# Patient Record
Sex: Female | Born: 1935 | Race: White | Hispanic: No | Smoking: Never smoker
Health system: Southern US, Community
[De-identification: ages and names within clinical notes are randomized; demographics above are authoritative.]

## PROBLEM LIST (undated history)

## (undated) DIAGNOSIS — I82409 Acute embolism and thrombosis of unspecified deep veins of unspecified lower extremity: Secondary | ICD-10-CM

## (undated) DIAGNOSIS — E785 Hyperlipidemia, unspecified: Secondary | ICD-10-CM

## (undated) DIAGNOSIS — K219 Gastro-esophageal reflux disease without esophagitis: Secondary | ICD-10-CM

## (undated) DIAGNOSIS — M1990 Unspecified osteoarthritis, unspecified site: Secondary | ICD-10-CM

## (undated) DIAGNOSIS — I8390 Asymptomatic varicose veins of unspecified lower extremity: Secondary | ICD-10-CM

## (undated) HISTORY — DX: Hyperlipidemia, unspecified: E78.5

## (undated) HISTORY — DX: Acute embolism and thrombosis of unspecified deep veins of unspecified lower extremity: I82.409

## (undated) HISTORY — DX: Asymptomatic varicose veins of unspecified lower extremity: I83.90

## (undated) HISTORY — PX: APPENDECTOMY: SHX54

## (undated) HISTORY — PX: OTHER SURGICAL HISTORY: SHX169

## (undated) HISTORY — DX: Gastro-esophageal reflux disease without esophagitis: K21.9

## (undated) HISTORY — PX: VEIN LIGATION AND STRIPPING: SHX2653

---

## 2002-01-19 ENCOUNTER — Ambulatory Visit (HOSPITAL_COMMUNITY): Admission: RE | Admit: 2002-01-19 | Discharge: 2002-01-19 | Payer: Self-pay | Admitting: Gastroenterology

## 2004-10-30 ENCOUNTER — Other Ambulatory Visit: Admission: RE | Admit: 2004-10-30 | Discharge: 2004-10-30 | Payer: Self-pay | Admitting: *Deleted

## 2008-08-22 ENCOUNTER — Ambulatory Visit (HOSPITAL_BASED_OUTPATIENT_CLINIC_OR_DEPARTMENT_OTHER): Admission: RE | Admit: 2008-08-22 | Discharge: 2008-08-23 | Payer: Self-pay | Admitting: Orthopedic Surgery

## 2008-09-05 ENCOUNTER — Encounter: Admission: RE | Admit: 2008-09-05 | Discharge: 2008-09-05 | Payer: Self-pay | Admitting: Orthopedic Surgery

## 2008-09-08 ENCOUNTER — Inpatient Hospital Stay (HOSPITAL_COMMUNITY): Admission: RE | Admit: 2008-09-08 | Discharge: 2008-09-12 | Payer: Self-pay | Admitting: Surgery

## 2008-09-09 ENCOUNTER — Ambulatory Visit: Payer: Self-pay | Admitting: Physical Medicine & Rehabilitation

## 2008-11-21 ENCOUNTER — Other Ambulatory Visit: Admission: RE | Admit: 2008-11-21 | Discharge: 2008-11-21 | Payer: Self-pay | Admitting: Family Medicine

## 2008-11-22 ENCOUNTER — Encounter: Admission: RE | Admit: 2008-11-22 | Discharge: 2009-01-24 | Payer: Self-pay | Admitting: Orthopedic Surgery

## 2008-12-09 ENCOUNTER — Encounter: Admission: RE | Admit: 2008-12-09 | Discharge: 2008-12-09 | Payer: Self-pay | Admitting: Family Medicine

## 2011-03-12 NOTE — Op Note (Signed)
Rachel Spencer, Rachel Spencer                 ACCOUNT NO.:  [id number]   MEDICAL RECORD NO.:  [id number]          PATIENT TYPE:  AMB   LOCATION:  DSC                          FACILITY:  [facility]   PHYSICIAN:  Feliberto Gottron. Turner Daniels, M.D.   DATE OF BIRTH:  01/02/1934   DATE OF PROCEDURE:  08/22/2008  DATE OF DISCHARGE:                               OPERATIVE REPORT   PREOPERATIVE DIAGNOSIS:  Left ankle displaced bimalleolar fracture.   POSTOPERATIVE DIAGNOSIS:  Left ankle displaced bimalleolar fracture.   PROCEDURE:  Open reduction and internal fixation of left ankle  bimalleolar fracture using a 7-hole lateral one-third tubular plate with  3 proximal and 3 distal screws, 2 anterior-to-posterior interfragmentary  screws and a single medial cannulated 3.5-mm lag screw, because of  foreskin on the medial side of the ankle.   SURGEON:  Feliberto Gottron. Turner Daniels, MD   FIRST ASSISTANT:  Shirl Harris, PA-C   ANESTHETIC:  General endotracheal.   ESTIMATED BLOOD LOSS:  100 mL.   FLUID REPLACEMENT:  1400 mL of crystalloid.   DRAINS PLACED:  None.   TOURNIQUET TIME:  39 minutes.   INDICATIONS FOR PROCEDURE:  A 77 year old woman who slipped on a  driveway that had some acorns on it a few days ago and sustained a  displaced left ankle bimalleolar fracture and [facility].  Closed reduction was accomplished, and she was placed in a  posterior splint with a stirrup.  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  The skin looked pretty good.  The plan was to do a lateral  incision with the side plate and medially because it was minimally  displaced after the closed reduction attempt to hold out with a  cannulated screw.  Risks and benefits of surgery have been discussed and  questions answered.   DESCRIPTION OF PROCEDURE:  The patient was identified by  armband, taken  to the operating room at [facility].  Appropriate  anesthetic monitors were attached and received preoperative IV  antibiotics and was then taken to operating room 5 where the appropriate  anesthetic monitors were attached and general endotracheal anesthesia  induced with the patient in supine position.  Tourniquet applied to the  left calf.  Left lower extremity was prepped, draped in usual sterile  fashion from the tourniquet to the toes.  Limb wrapped with an Esmarch  bandage.  The tourniquet was inflated to 300 mmHg and a standard time-  out procedure was then performed.  Starting on the lateral side, the  incision was made from the tip of the lateral malleolus proximally for  about 10 cm through the skin and the subcutaneous tissue.  Small  bleeders were identified and cauterized.  A transversing vein was  preserved throughout the procedure.  We went down to the periosteum over  the fracture site, was reflected anteriorly and posteriorly and then  using  a tenaculum, applied traction to the distal fragment and held the  reduction with a lion-jaw clamp.  Two anterior-to-posterior 4.0-mm  cancellous lag screws were then used because of the patient's relatively  soft bone quality and then we fixed the fibula itself with a 7-hole one-  third tubular neutralization plate, 3 cortical screws proximally and  then 3 distal cancellous screws, one of them bicortical above this  plafond and 2 of them unicortical below the plafond.  C-arm images were  taken confirming excellent reduction of the fibula and then under C-arm  control, a guide pin from the 3.5 cannulated screw set was placed up  through the tip of the medial malleolus through the fracture site and up  into the metaphysis of the tibia in satisfactory position.  It was then  overreamed with a reamer for the 3.5 cannulated screw and then a 46-mm  3.5 cannulated screw was placed over the guidewire obtaining  good firm  fixation of the medial malleolus.  Final C-arm images were taken.  The  incision on the medial side is about 2-3 mm in length.  At this point,  the wounds were irrigated out with normal saline solution, the  tourniquet was let down, the subcutaneous tissues laterally was closed  with running 3-0 Vicryl suture.  The skin with running interlocking 3-0  nylon suture.  A dressing of Xeroform, 4 x 4 dressings, sponges, Webril,  and Ace wrap, and a Cam walker boot was then applied.  The patient was  awakened and taken to the recovery room without difficulty.      Feliberto Gottron. Turner Daniels, M.D.  Electronically Signed     FJR/MEDQ  D:  08/22/2008  T:  08/23/2008  Job:  [id number]

## 2011-03-12 NOTE — Op Note (Signed)
Rachel Spencer, Rachel Spencer                 ACCOUNT NO.:  [id number]   MEDICAL RECORD NO.:  [id number]          PATIENT TYPE:  INP   LOCATION:  5039                         FACILITY:  [facility]   PHYSICIAN:  Doralee Albino. Carola Frost, M.D. DATE OF BIRTH:  [birth year]   DATE OF PROCEDURE:  09/08/2008  DATE OF DISCHARGE:  09/12/2008                               OPERATIVE REPORT   PREOPERATIVE DIAGNOSIS:  Loss of reduction, left trimalleolar ankle  fracture.   POSTOPERATIVE DIAGNOSIS:  Loss of reduction, left trimalleolar ankle  fracture.   PROCEDURES:  Revision open reduction and internal fixation of  trimalleolar ankle fracture with fixation of the posterior lip,  reduction of left tibiotalar ankle subluxation and 3 application of the  external fixator, left ankle.   SURGEON:  Doralee Albino. Carola Frost, MD   ASSISTANT:  Mearl Latin, PA-C   ANESTHESIA:  General.   COMPLICATIONS:  None.   SPECIMENS:  None.   ESTIMATED BLOOD LOSS:  Minimal.   TOURNIQUET:  None.   DISPOSITION:  To PACU.   CONDITION:  Stable.   BRIEF SUMMARY AND INDICATIONS FOR PROCEDURE:  Rachel Spencer is a 77-year-  old female status post ORIF of a trimalleolar fracture by Dr. Turner Daniels back  in early November.  Between the that time of surgery and her first  followup visit, she did stumble and feels that she may have put a slight  amount of weight on her ankle.  On return to the office, she had of  visible deformity and x-rays confirmed loss of reduction with a large  posterior malleolar fragment that was superiorly displaced.  I discussed  with Rachel Spencer and her husband the risk and benefits of revision ORIF  including the possibility of infection, nerve injury, vessel injury,  need for further surgery, malunion, nonunion, DVT, PE, and multiple  others.  She wished to proceed.   DESCRIPTION OF PROCEDURE:  Rachel Spencer was taken to the operating room  after administration of preoperative antibiotics.  General anesthesia  was induced.  Her  left lower extremity was prepped and draped in the  usual sterile fashion.  Given the previous chronic draining ulcer from  her medial distal tibia, an incision in this area or posteriorly was  contraindicated.  Consequently, I applied the external fixator placing 2  pins in the tibia and transcalcaneal pin followed by 2 pins in the  metatarsals to maintain the ankle in a plantigrade position.  With the  use of traction from the assistant, Montez Morita, we were able to apply  distraction in center of the talus within the tibial plafond.  This did  not, however, reduce the posterior malleolar fragment which remained  superiorly displaced.  Consequently, an open reduction was indicated.  This was performed by using the ball spike pusher introduced through a  posterolateral incision to mobilize the fragment first with a Glorious Peach and  then again reducing it with the ball spike pusher.  At that time, a  cannulated guide pin was then advanced across at the level of the  plafond into the posterior fragment and a  partially threaded screw used  to compress it.  We were unable to obtain an anatomic reduction and  there remained 1.5 mm of step-off at the articular surface.  Given her  age and the local conditions at the fracture site regarding her medial  wound, we felt this to be acceptable and preferred to more extended  incision that again could risk deep infection and eventual limb loss.  Final AP mortise and lateral films confirmed appropriate reduction and  hardware placement of all the external fixator pin as well as the  cannulated screw.  Again, Montez Morita, assisted throughout the procedure  and was necessary to the effective completion of the case.  The patient  was then awakened from anesthesia and transferred to PACU in stable  condition.   PROGNOSIS:  Rachel Spencer will be nonweightbearing for the next 6-8 weeks and  graduated weightbearing thereafter.  Anticipate removal of the fixator  around  that time with conversion to boot or cast for weightbearing.      Doralee Albino. Carola Frost, M.D.  Electronically Signed     MHH/MEDQ  D:  12/06/2008  T:  12/06/2008  Job:  [id number]

## 2011-03-12 NOTE — Discharge Summary (Signed)
NAMEEARNEST, THALMAN                 ACCOUNT NO.:  [id number]   MEDICAL RECORD NO.:  [id number]          PATIENT TYPE:  INP   LOCATION:  5039                         FACILITY:  [facility]   PHYSICIAN:  Doralee Albino. Carola Frost, M.D. DATE OF BIRTH:  02-05-1934   DATE OF ADMISSION:  09/08/2008  DATE OF DISCHARGE:  09/12/2008                               DISCHARGE SUMMARY   DISCHARGE DIAGNOSIS:  Loss of fixation left tibial pilon fracture.   ADDITIONAL DISCHARGE DIAGNOSIS:  Migraine headaches.   PROCEDURES PERFORMED:  1. On September 08, 2008, application of external fixator left ankle.  2. Reduction under anesthesia with external fixation.  3. Limited open reduction and internal fixation with anterior-      posterior screw and posterior malleolar fragment of left distal      tibia.   BRIEF HISTORY AND HOSPITAL COURSE:  Ms. Krieger is a very pleasant 77-year-  old Caucasian female who initially presented to our office on September 07, 2008, from Dr. Turner Daniels after it was noted that her left foot was  beginning to drift out of the appropriate alignment as well as noted  loss of fixation of Ms. Papadopoulos's left pilon fracture.  Given the complex  nature of the injury, Dr. Turner Daniels sought the consultation from Dr. Carola Frost,  an orthopedic trauma specialist, for possible treatment of Ms. Dao's  injury.  Ms. Hyden was again seen on September 07, 2008, where it was  determined that we would fix her fracture but given the condition of her  soft tissue it would be best suited for external fixation.  Ms. Stellmach  does have a history of poor wound healing and it is our concern that  approaching this injury is an extensive approach, would cause more harm  than good, and therefore, we opted for external fixation of her left  tibial pilon fracture.  In addition, utilization on the external fixator  would also help reestablish appropriate alignment of the patient's foot.  Ms. Kildow was brought to the operating room on September 08, 2008, where  she underwent the procedure described as above.  She tolerated the  procedure well and did not have any acute issues in the immediate  perioperative period.  After surgery, Ms. Mcclellan was brought to the  postanesthesia care unit where recovered from anesthesia and was then  brought up to the orthopedic floor for continued observation and pain  control.  We did initially intend for her to be a 23-hour observation;  however, because of some logistical issues at home, we opted to have Ms.  Falin seen by inpatient rehabilitation as we thought she would be a good  candidate for such.  Ms. Leib was then seen by Rehab Medicine and it was  determined that she lacked medical necessity for inpatient rehab.  In  addition, several early physical therapy notes demonstrated that Ms.  Berwanger would be a good candidate for skilled nursing facility.  Therefore,  we changed Ms. Ravenscroft status to inpatient to facilitate acquisition of a  SNF bed.  Over the weekend, Ms. He progressed with physical  therapy  extraordinarily well was ambulating with walker and doing so  independently including stairs.  Therefore, at the end of the weekend it  was suggested that Ms. Sockwell be discharged home with home health physical  therapy as these were her wishes also.  On postoperative day #4, the  patient was doing outstanding and was deemed stable enough to the be  discharged from the hospital to home with home health physical therapy.  The clinical encounter note for postoperative day #4 is as follows:  SUBJECTIVE AND OBJECTIVE:  The patient is doing fantastic, worked very  well with PT over the weekend, navigated stairs without issues.  She  does not want to go home with home health PT and no new issues at this  time.  VITAL SIGNS:  Temperature 97.8, heart rate 84, respirations 20, 96% on  room, and BP is 112/73.  GENERAL:  The patient is awake and alert in no acute distress and is  comfortable.  LUNGS:  Clear to  auscultation bilaterally.  CARDIAC:  Regular rate and rhythm.  ABDOMEN:  Soft, nontender, with positive bowel sounds.  EXTREMITIES:  Left lower extremity asterixis stable and intact.  Pin  sites are clean, dry, and intact.  No drainage or signs of infection.  Incisions are clean, dry, and intact with edema of the foot noted.  Ms.  Schmuhl does have some breakdown on the medial aspect of her distal left  leg where she is documented wound healing problems in the past.  It does  appear that this is limited to the epidermis as there does appear to be  healthy tissue underneath that is viable.  There is scant serous  drainage appreciated from the wound.  Flexion and extension of the great  and lesser toes are intact.  Deep peroneal nerves, superficial peroneal  nerves, and tibial nerves sensations are intact to light touch.  No deep  calf tenderness through this foot with improved alignment.   PERTINENT LABS:  Immediate postop CBC; white blood cell 7.3, hemoglobin  11.6, hematocrit 34.2, and platelets 223.  Sodium 141, potassium 3.8,  chloride 107, CO2 is 30, glucose 108, BUN 11, and creatinine 0.75.   ASSESSMENT AND PLAN:  A 77 year old female status post Ex-fix for left  pilon fracture.  1. Nonweightbearing left lower extremity.  2. Lovenox for deep vein thrombosis prophylaxis x10 days.  I do      believe that she will be mobile and does not require any additional      deep vein thrombosis prophylaxis.  3. We will discharge the patient home with home health physical      therapy.  4. Follow up in 1 week with our office.  5. The patient can ambulate with walker and perform activities within      her weightbearing restrictions.   DISCHARGE MEDICATIONS:  1. Norco 5/325 one to two p.o. q.6 h. as needed for pain.  2. Lovenox 40 mg 1 subcutaneous injection daily x10 days.   The patient can resume her home medications as follows:  1. Ranitidine 75 mg 1 p.o. daily.  2. Cymbalta 60 mg p.o.  daily.  3. Topamax 200 mg p.o. nightly.  4. Sumatriptan 100 mg p.o. p.r.n.   DISCHARGE INSTRUCTIONS AND PLAN:  Ms. Martinezgarcia did sustain a pretty  significant injury to her left distal tibia.  We were able to achieve  good fixation with external fixator as well as improved alignment of her  left foot and the  fracture fragments.  Ms. Kuipers will continued to be  nonweightbearing on her left lower extremity for the next 6-8 weeks.  I  would anticipate that the fixator would remain in place for that time as  well.  She should continue with routine dressing changes.  She has been  given wound care sheath, which outlines the necessary dressing changes  and materials that need to be used.  Pin site should be dressed on every  other day basis.  She would also keep the close eye on that medial wound  for worsening or further breakdown and should contact our office if she  notes that as well.  We also informed Ms. Pennywell as to other signs of  possible infection which include purulent drainage on the pin sites as  well as increased redness from the pin sites as well.  As soon as the  patient notice these, she will contact the office as well.  Ms. Perryman can  engage in activities that she is able to tolerate, and that she is able  to maintain her nonweightbearing restrictions.  Ms. Dolbow will also  continue on Lovenox therapy for DVT prophylaxis for another 10 days.  I  do believe that she will be mobile enough as this Lovenox will suffice  as DVT prophylaxis, and she should not need any longer therapy.  Ms.  Mancebo will follow up with our office in 1 week at which time we will  reevaluate her wounds and obtain x-ray of her left ankle to evaluate  overall alignment and fracture healing.  Ms. Cherry will also be  discharged with home health physical therapy.  We will assist Ms. Huot  with mobility and other activities.      Mearl Latin, PA      Doralee Albino. Carola Frost, M.D.  Electronically Signed    KWP/MEDQ   D:  09/12/2008  T:  09/12/2008  Job:  [id number]

## 2011-03-15 NOTE — Procedures (Signed)
[facility]  Patient:    Rachel Spencer, Rachel Spencer Visit Number: [id number] MRN: [id number]          Service Type: END Location: ENDO Attending Physician:  Orland Mustard Dictated by:   Llana Aliment. Randa Evens, M.D. Proc. Date: 01/19/02 Admit Date:  01/19/2002                             Procedure Report  DATE OF BIRTH:  [birth year]  PROCEDURE:  Colonoscopy.  MEDICATIONS:  Fentanyl 50 mcg, Versed 5 mg IV.  SCOPE:  Olympus pediatric video colonoscope.  INDICATIONS FOR PROCEDURE:  Colon cancer screening.  DESCRIPTION OF PROCEDURE:  The procedure had been explained to the patient and consent obtained. With the patient in the left lateral decubitus position, the Olympus pediatric video colonoscope was inserted and advanced under direct visualization. The prep was excellent. We were able to reach the cecum without difficulty. The ileocecal valve and appendiceal orifice were seen. The scope was withdrawn and the cecum, ascending colon, hepatic flexure, transverse colon, splenic flexure, descending and sigmoid colon were seen well upon withdrawn and no polyps or other lesions were seen. There was on significant diverticular disease. The scope withdrawn, patient tolerated the procedure well.  ASSESSMENT:  Essentially normal screening colonoscopy with no polyps.  PLAN:  Will recommend the patient have routine follow-ups with yearly Hemoccults. Consider another screening procedure in 10 years. Dictated by:   Llana Aliment. Randa Evens, M.D. Attending Physician:  Orland Mustard DD:  01/19/02 TD:  01/20/02 Job: [id number]

## 2011-07-30 LAB — PROTIME-INR
INR: 1
Prothrombin Time: 13.2

## 2011-07-30 LAB — CBC
HCT: 34.2 — ABNORMAL LOW
Hemoglobin: 11.6 — ABNORMAL LOW
MCHC: 33.9
Platelets: 223
RDW: 12.6

## 2011-07-30 LAB — BASIC METABOLIC PANEL
BUN: 11
CO2: 25
CO2: 30
Calcium: 8.9
Calcium: 9.8
Chloride: 107
Creatinine, Ser: 0.73
GFR calc Af Amer: >60
GFR calc non Af Amer: >60
GFR calc non Af Amer: >60
Glucose, Bld: 89
Potassium: 3.8
Potassium: 3.8
Sodium: 140

## 2011-07-30 LAB — TYPE AND SCREEN: ABO/RH(D): O POS

## 2011-07-30 LAB — DIFFERENTIAL
Basophils Absolute: 0
Eosinophils Absolute: 0.2
Monocytes Relative: 7
Neutrophils Relative %: 59

## 2011-07-30 LAB — URINE CULTURE

## 2011-07-30 LAB — URINALYSIS, ROUTINE W REFLEX MICROSCOPIC
Bilirubin Urine: NEGATIVE
Ketones, ur: NEGATIVE
Nitrite: NEGATIVE
Protein, ur: NEGATIVE
Urobilinogen, UA: 0.2
pH: 7.5

## 2011-11-12 ENCOUNTER — Other Ambulatory Visit (HOSPITAL_COMMUNITY)
Admission: RE | Admit: 2011-11-12 | Discharge: 2011-11-12 | Disposition: A | Discharge status: Home / Self Care | Payer: Medicare Other | Source: Ambulatory Visit | Attending: Family Medicine | Admitting: Family Medicine

## 2011-11-12 DIAGNOSIS — Z124 Encounter for screening for malignant neoplasm of cervix: Secondary | ICD-10-CM | POA: Insufficient documentation

## 2012-01-23 ENCOUNTER — Encounter: Payer: Self-pay | Admitting: Vascular Surgery

## 2012-01-27 ENCOUNTER — Ambulatory Visit (INDEPENDENT_AMBULATORY_CARE_PROVIDER_SITE_OTHER): Payer: Medicare Other | Admitting: Vascular Surgery

## 2012-01-27 ENCOUNTER — Ambulatory Visit (INDEPENDENT_AMBULATORY_CARE_PROVIDER_SITE_OTHER): Payer: Medicare Other | Admitting: *Deleted

## 2012-01-27 ENCOUNTER — Other Ambulatory Visit: Payer: Self-pay | Admitting: *Deleted

## 2012-01-27 ENCOUNTER — Encounter: Payer: Self-pay | Admitting: Vascular Surgery

## 2012-01-27 VITALS — BP 141/68 | HR 65 | Resp 16 | Ht 64.0 in | Wt 130.0 lb

## 2012-01-27 DIAGNOSIS — Z872 Personal history of diseases of the skin and subcutaneous tissue: Secondary | ICD-10-CM

## 2012-01-27 DIAGNOSIS — I83893 Varicose veins of bilateral lower extremities with other complications: Secondary | ICD-10-CM | POA: Insufficient documentation

## 2012-01-27 DIAGNOSIS — I8390 Asymptomatic varicose veins of unspecified lower extremity: Secondary | ICD-10-CM

## 2012-01-27 NOTE — Progress Notes (Signed)
Subjective:     Patient ID: Rachel Spencer, female   DOB: [birth year], 78 y.o.   MRN: [id number]  HPI this 78 year old female has a long history of venous insufficiency of both lower extremities. She had a right great saphenous vein stripping performed in 1960s with a good result. She developed severe varicose veins in the left leg and had some type of surgical procedure in the 1990s which was not a vein stripping. She also had multiple skin grafts for stasis ulcers over the past several years. She is not wear elastic compression stockings nor elevate her legs or regular basis. She has developed severe darkening of the skin in the lower third of the left leg with the history of ulcers which have all healed after bed rest and elevation. She has no history of DVT, thrombophlebitis, or bleeding. Left leg causes aching throbbing burning and stinging discomfort which worsens as the day progresses her leg becomes more edematous.   Past Medical History  Diagnosis Date  . Varicose veins     History  Substance Use Topics  . Smoking status: Never Smoker   . Smokeless tobacco: Not on file  . Alcohol Use:     Family History  Problem Relation Age of Onset  . Heart disease Mother   . Hypertension Mother   . Heart disease Father   . Hyperlipidemia Father   . Hypertension Father     No Known Allergies  Current outpatient prescriptions:acetaminophen (TYLENOL) 325 MG tablet, Take 650 mg by mouth every 6 (six) hours as needed., Disp: , Rfl: ;  Multiple Vitamins-Minerals (CENTRUM SILVER PO), Take by mouth., Disp: , Rfl: ;  topiramate (TOPAMAX) 200 MG tablet, Take 200 mg by mouth 2 (two) times daily., Disp: , Rfl:   BP 141/68  Pulse 65  Resp 16  Ht 5\' 4"  (1.626 m)  Wt 130 lb (58.968 kg)  BMI 22.31 kg/m2  Body mass index is 22.31 kg/(m^2).         Review of Systems denies chest pain, dyspnea on exertion, PND, orthopnea, chronic bronchitis, hemoptysis. She cannot ambulate long distances. Denies  claudication. Other systems are negative and a complete review of systems    Objective:   Physical Exam blood pressure 141/68 heart rate 65 respirations 16 Gen.-alert and oriented x3 in no apparent distress HEENT normal for age Lungs no rhonchi or wheezing Cardiovascular regular rhythm no murmurs carotid pulses 3+ palpable no bruits audible Abdomen soft nontender no palpable masses Musculoskeletal free of  major deformities Skin clear -no rashes Neurologic normal Lower extremities 3+ femoral and dorsalis pedis pulses palpable bilaterally with 1+ edema on the left no edema on the right. Left leg has severe bulging varicosities beginning in the medial knee area extending down to the ankle with severe darkening and thickening of the skin the medial aspect of the lower half of the left calf down to the medial malleolus with evidence of previous healed ulcerations. There also bulging varicosities extending down onto the dorsum of the foot near the medial malleolus. Right leg has a few varicosities in the medial calf of the great saphenous area but no hyperpigmentation or ulceration or thickening of the skin.  Today I ordered a venous duplex exam of the left leg which are reviewed and interpreted. The great saphenous vein has gross reflux from the junction to the knee supplying these bulging varicosities. The left small saphenous vein also has gross reflux  and varies in size from 0.3 3.38 cm. There  is no DVT but there is some mild reflux in the deep system       Assessment:     Severe venous insufficiency left leg with gross reflux left great saphenous vein due to valvular incompetence with history of multiple stasis ulcers and severe thickening and darkening of the skin with chronic edema left leg-affecting her daily living    Plan:     #1 long-leg elastic compression stockings 20-30 mm gradient #2 elevate legs as much as possible intermittently during the day #3 patient cannot take  ibuprofen #4 return in 3 months -if no significant improvement which is highly doubtful given severe skin changes she should have laser ablation left great saphenous vein and then return in 3 months for likely stab phlebectomy of secondary varicosities

## 2012-02-03 NOTE — Procedures (Unsigned)
LOWER EXTREMITY VENOUS REFLUX EXAM  INDICATION:  Asymptomatic varicose veins, history of left lower extremity ulcer.  EXAM:  Using color-flow imaging and pulse Doppler spectral analysis, the left common femoral, femoral, popliteal, posterior tibial, great and small saphenous veins are evaluated.  There is evidence suggesting deep venous Reflux of >537milliseconds noted throughout the left lower extremity.  The left saphenofemoral junction and non-tortuous great saphenous vein demonstrate Reflux of >545milliseconds with caliber measurements as described below.  The left proximal small saphenous vein demonstrates Reflux of >559milliseconds with diameter measurements ranging from 0.33 to 0.39 cm.  GSV Diameter (used if found to be incompetent only)                                           Right    Left Proximal Greater Saphenous Vein           cm       0.73 cm Proximal-to-mid-thigh                     cm       cm Mid thigh                                 cm       0.65 cm Mid-distal thigh                          cm       cm Distal thigh                              cm       0.74 cm Knee                                      cm       0.25 cm  IMPRESSION: 1. Left great and small saphenous vein reflux is noted, as described     above. 2. The left lower extremity venous system demonstrates reflux as     described above.  ___________________________________________ Quita Skye. Hart Rochester, M.D.  CH/MEDQ  D:  01/29/2012  T:  01/29/2012  Job:  [id number]

## 2012-05-04 ENCOUNTER — Encounter: Payer: Self-pay | Admitting: Neurosurgery

## 2012-05-05 ENCOUNTER — Ambulatory Visit (INDEPENDENT_AMBULATORY_CARE_PROVIDER_SITE_OTHER): Payer: Medicare Other | Admitting: Vascular Surgery

## 2012-05-05 ENCOUNTER — Encounter: Payer: Self-pay | Admitting: Vascular Surgery

## 2012-05-05 VITALS — BP 105/51 | HR 58 | Resp 16 | Ht 64.0 in | Wt 127.0 lb

## 2012-05-05 DIAGNOSIS — I83893 Varicose veins of bilateral lower extremities with other complications: Secondary | ICD-10-CM

## 2012-05-05 NOTE — Progress Notes (Signed)
Subjective:     Patient ID: Rachel Spencer, female   DOB: 1934-05-06, 78 y.o.   MRN: [id number]  HPI this 78 year old female returns for further followup regarding her severe venous insufficiency of the left leg. She has a history of multiple stasis ulcers many years ago with multiple skin grafts. He has had previous vein stripping on the right side. She has severe skin changes in the left ankle and has aching throbbing and burning discomfort which worsens as the day progresses. She develops edema in the ankle as well. She has no history of DVT or thrombophlebitis.  Past Medical History  Diagnosis Date  . Varicose veins     History  Substance Use Topics  . Smoking status: Never Smoker   . Smokeless tobacco: Not on file  . Alcohol Use:     Family History  Problem Relation Age of Onset  . Heart disease Mother   . Hypertension Mother   . Heart disease Father   . Hyperlipidemia Father   . Hypertension Father     No Known Allergies  Current outpatient prescriptions:acetaminophen (TYLENOL) 325 MG tablet, Take 650 mg by mouth every 6 (six) hours as needed., Disp: , Rfl: ;  Multiple Vitamins-Minerals (CENTRUM SILVER PO), Take by mouth., Disp: , Rfl: ;  topiramate (TOPAMAX) 200 MG tablet, Take 200 mg by mouth 2 (two) times daily., Disp: , Rfl:   BP 105/51  Pulse 58  Resp 16  Ht 5\' 4"  (1.626 m)  Wt 127 lb (57.607 kg)  BMI 21.80 kg/m2  Body mass index is 21.80 kg/(m^2).          Review of Systems denies chest pain, dyspnea on exertion, PND, orthopnea, hemoptysis, claudication     Objective:   Physical Exam blood pressure 105/51 heart rate 58 respirations 16 General well-developed well-nourished elderly female no apparent stress alert and oriented x3 Lungs no rhonchi or wheezing Left leg with severe hyperpigmentation and thickening of the skin the lower third of the left leg medially over the great saphenous system. There is a large collection of bulging varicosities in the  distal thigh of the great saphenous system extending into the medial calf below the knee. 3+ dorsalis pedis pulses palpable.     Assessment:     Severe skin changes left leg secondary to gross reflux left great and small saphenous systems with history of stasis ulcers skin grafting and severe pain not responding to conservative measures including long-leg elastic compression stockings 20-30 mm gradient, elevation, and ibuprofen.    Plan:     Patient needs #1 laser ablation left great saphenous vein followed by #2 laser ablation left small saphenous vein. Patient should then return in 3 months to be evaluated for possible stab phlebectomy of secondary varicosities left leg. Will proceed with precertification to perform this in the near

## 2012-06-05 ENCOUNTER — Encounter: Payer: Self-pay | Admitting: Vascular Surgery

## 2012-06-08 ENCOUNTER — Ambulatory Visit (INDEPENDENT_AMBULATORY_CARE_PROVIDER_SITE_OTHER): Payer: Medicare Other | Admitting: Vascular Surgery

## 2012-06-08 ENCOUNTER — Encounter: Payer: Self-pay | Admitting: Vascular Surgery

## 2012-06-08 VITALS — BP 104/68 | HR 64 | Resp 16 | Ht 64.0 in | Wt 128.0 lb

## 2012-06-08 DIAGNOSIS — I83893 Varicose veins of bilateral lower extremities with other complications: Secondary | ICD-10-CM

## 2012-06-08 NOTE — Progress Notes (Signed)
Laser Ablation Procedure      Date: 06/08/2012    Rachel Spencer DOB:12-16-1933  Consent signed: Yes  Surgeon:J.D. Hart [place]  Procedure: Laser Ablation: left Greater Saphenous Vein  BP 104/68  Pulse 64  Resp 16  Ht 5\' 4"  (1.626 m)  Wt 128 lb (58.06 kg)  BMI 21.97 kg/m2  Start time: 3:00  End time: 3:40  Tumescent Anesthesia: 200 cc 0.9% NaCl with 50 cc Lidocaine HCL with 1% Epi and 15 cc 8.4% NaHCO3  Local Anesthesia: 6 cc Lidocaine HCL and NaHCO3 (ratio 2:1)  Pulsed mode: Watts 15 Seconds 1 Pulses:1 Total Pulses:97 Total Energy: 1455 Total Time: 1:37    Patient tolerated procedure well: Yes  Notes:   Description of Procedure:  After marking the course of the saphenous vein and the secondary varicosities in the standing position, the patient was placed on the operating table in the supine position, and the left leg was prepped and draped in sterile fashion. Local anesthetic was administered, and under ultrasound guidance the saphenous vein was accessed with a micro needle and guide wire; then the micro puncture sheath was placed. A guide wire was inserted to the saphenofemoral junction, followed by a 5 french sheath.  The position of the sheath and then the laser fiber below the junction was confirmed using the ultrasound and visualization of the aiming beam.  Tumescent anesthesia was administered along the course of the saphenous vein using ultrasound guidance. Protective laser glasses were placed on the patient, and the laser was fired at 15 watt pulsed mode advancing 1-2 mm per sec.  For a total of 1455 joules.  A steri strip was applied to the puncture site.    ABD pads and thigh high compression stockings were applied.  Ace wrap bandages were applied over the phlebectomy sites and at the top of the saphenofemoral junction.  Blood loss was less than 15 cc.  The patient ambulated out of the operating room having tolerated the procedure well.

## 2012-06-08 NOTE — Progress Notes (Signed)
Subjective:     Patient ID: Rachel Spencer, female   DOB: January 07, 1934, 78 y.o.   MRN: [id number]  HPI this 78 year old female had laser ablation left great saphenous vein performed under local tumescent anesthesia. This was done for venous hypertension with a venous stasis ulcer in the left ankle area. A total of 1455 J of energy was utilized. She tolerated the procedure  Review of Systems     Objective:   Physical ExamBP 104/68  Pulse 64  Resp 16  Ht 5\' 4"  (1.626 m)  Wt 128 lb (58.06 kg)  BMI 21.97 kg/m2      Assessment:    well-tolerated laser ablation left great saphenous vein for venous hypertension with stasis ulcer performed under local tumescent anesthesia    Plan:     Return on August 20 for venous duplex exam left leg to confirm closure left great saphenous vein Will then return in September for similar procedure on left small saphenous vein

## 2012-06-09 ENCOUNTER — Telehealth: Payer: Self-pay | Admitting: *Deleted

## 2012-06-09 ENCOUNTER — Encounter: Payer: Self-pay | Admitting: Vascular Surgery

## 2012-06-09 NOTE — Telephone Encounter (Signed)
Patient doing well with her leg but having a migraine/nausea. Following all instructions. Unable to take Ibuprofen so taking tylenol and using ice. Reminded her of her fu appt. Next week.

## 2012-06-15 ENCOUNTER — Encounter: Payer: Self-pay | Admitting: Vascular Surgery

## 2012-06-15 ENCOUNTER — Other Ambulatory Visit: Payer: Self-pay | Admitting: *Deleted

## 2012-06-15 DIAGNOSIS — I83893 Varicose veins of bilateral lower extremities with other complications: Secondary | ICD-10-CM

## 2012-06-16 ENCOUNTER — Ambulatory Visit (INDEPENDENT_AMBULATORY_CARE_PROVIDER_SITE_OTHER): Payer: Medicare Other | Admitting: Vascular Surgery

## 2012-06-16 ENCOUNTER — Encounter (INDEPENDENT_AMBULATORY_CARE_PROVIDER_SITE_OTHER): Payer: Medicare Other | Admitting: *Deleted

## 2012-06-16 ENCOUNTER — Encounter: Payer: Self-pay | Admitting: Vascular Surgery

## 2012-06-16 ENCOUNTER — Ambulatory Visit: Payer: Medicare Other | Admitting: Vascular Surgery

## 2012-06-16 VITALS — BP 127/52 | HR 62 | Resp 16 | Ht 64.0 in | Wt 128.0 lb

## 2012-06-16 DIAGNOSIS — I83893 Varicose veins of bilateral lower extremities with other complications: Secondary | ICD-10-CM

## 2012-06-16 NOTE — Progress Notes (Signed)
Subjective:     Patient ID: Rachel Spencer, female   DOB: 05/28/1934, 78 y.o.   MRN: [id number]  HPI this 78 year old female returns 1 week post laser ablation left great saphenous vein performed for reflux and due to valvular incompetence in the left great saphenous system causing painful varicosities and with severe skin changes distally. Patient has had a moderate amount of discomfort in the left thigh which is improving. She's had no distal edema. She was unable to take ibuprofen because she does not tolerate anti-inflammatory medications well. She has worn her long leg elastic compression stocking as instructed.  Past Medical History  Diagnosis Date  . Varicose veins     History  Substance Use Topics  . Smoking status: Never Smoker   . Smokeless tobacco: Not on file  . Alcohol Use:     Family History  Problem Relation Age of Onset  . Heart disease Mother   . Hypertension Mother   . Heart disease Father   . Hyperlipidemia Father   . Hypertension Father     No Known Allergies  Current outpatient prescriptions:acetaminophen (TYLENOL) 325 MG tablet, Take 650 mg by mouth every 6 (six) hours as needed., Disp: , Rfl: ;  Multiple Vitamins-Minerals (CENTRUM SILVER PO), Take by mouth., Disp: , Rfl: ;  topiramate (TOPAMAX) 200 MG tablet, Take 200 mg by mouth 2 (two) times daily., Disp: , Rfl:   BP 127/52  Pulse 62  Resp 16  Ht 5\' 4"  (1.626 m)  Wt 128 lb (58.06 kg)  BMI 21.97 kg/m2  Body mass index is 21.97 kg/(m^2).          Review of Systems denies chest pain, dyspnea on exertion, PND, orthopnea, hemoptysis, claudication    Objective:   Physical Exam blood pressure 127/52 heart rate 62 respirations 16 General well-developed well-nourished female in no apparent distress alert and oriented x3 Lungs no rhonchi or wheezing Cardiovascular regular rhythm no murmurs carotid pulses 3+ no audible bruits Left leg with some bruising along the course of the left great saphenous  system with the great saphenous vein palpable and mildly tender. No distal edema noted. Bulging varicosities in the distal calf and distal thigh area. Severe hyperpigmentation lower third left leg with palpable dorsalis pedis pulse.  Today I ordered a venous duplex exam of the left leg which are reviewed and interpreted. There is no DVT. There is total closure of the left great saphenous vein from the distal thigh to the saphenofemoral junction.     Assessment:     Successful laser ablation left great saphenous vein for venous hypertension with severe skin changes    Plan:     Patient had laser ablation left small saphenous vein September 9 and then return in 3 months to see if stab phlebectomy will be necessary

## 2012-07-03 ENCOUNTER — Encounter: Payer: Self-pay | Admitting: Vascular Surgery

## 2012-07-06 ENCOUNTER — Ambulatory Visit (INDEPENDENT_AMBULATORY_CARE_PROVIDER_SITE_OTHER): Payer: Medicare Other | Admitting: Vascular Surgery

## 2012-07-06 ENCOUNTER — Encounter: Payer: Self-pay | Admitting: Vascular Surgery

## 2012-07-06 ENCOUNTER — Other Ambulatory Visit: Payer: Self-pay | Admitting: *Deleted

## 2012-07-06 VITALS — BP 125/70 | HR 66 | Resp 16 | Ht 64.0 in | Wt 128.0 lb

## 2012-07-06 DIAGNOSIS — I83893 Varicose veins of bilateral lower extremities with other complications: Secondary | ICD-10-CM

## 2012-07-06 NOTE — Progress Notes (Signed)
Subjective:     Patient ID: Rachel Spencer, female   DOB: 08-Sep-1934, 78 y.o.   MRN: [id number]  HPI this 78 year old female had laser ablation of the left small saphenous vein performed under local tumescent anesthesia for venous hypertension in the left leg with bulging varicosities. She tolerated the procedure well. A total of approximately 900 J of energy was utilized.  Review of Systems     Objective:   Physical Exam BP 125/70  Pulse 66  Resp 16  Ht 5\' 4"  (1.626 m)  Wt 128 lb (58.06 kg)  BMI 21.97 kg/m2      Assessment:    well-tolerated laser ablation left small saphenous vein performed under local tumescent anesthesia for venous hypertension    Plan:     Return in one week for venous duplex exam left leg confirm closure left small saphenous vein Patient will then return in 3 months to see if stab phlebectomy of secondary varicosities as necessary

## 2012-07-06 NOTE — Progress Notes (Signed)
Laser Ablation Procedure      Date: 07/06/2012    Rachel Spencer DOB:1934-04-11  Consent signed: Yes  Surgeon:J.D. Hart [place]  Procedure: Laser Ablation: left Small Saphenous Vein  BP 125/70  Pulse 66  Resp 16  Ht 5\' 4"  (1.626 m)  Wt 128 lb (58.06 kg)  BMI 21.97 kg/m2  Start time: 1:20   End time: 1:55  Tumescent Anesthesia: 200 cc 0.9% NaCl with 50 cc Lidocaine HCL with 1% Epi and 15 cc 8.4% NaHCO3  Local Anesthesia: 5 cc Lidocaine HCL and NaHCO3 (ratio 2:1)  Pulsed mode: Watts 15 Seconds 1 Pulses:1 Total Pulses:61 Total Energy: 915 Total Time: 1:01     Patient tolerated procedure well: Yes  Notes:   Description of Procedure:  After marking the course of the saphenous vein and the secondary varicosities in the standing position, the patient was placed on the operating table in the prone position, and the left leg was prepped and draped in sterile fashion. Local anesthetic was administered, and under ultrasound guidance the saphenous vein was accessed with a micro needle and guide wire; then the micro puncture sheath was placed. A guide wire was inserted to the saphenopopliteal junction, followed by a 5 french sheath.  The position of the sheath and then the laser fiber below the junction was confirmed using the ultrasound and visualization of the aiming beam.  Tumescent anesthesia was administered along the course of the saphenous vein using ultrasound guidance. Protective laser glasses were placed on the patient, and the laser was fired at 15 watt pulsed mode advancing 1-2 mm per sec.  For a total of 915 joules.  A steri strip was applied to the puncture site.  .  ABD pads and thigh high compression stockings were applied.  Ace wrap bandages were applied over the phlebectomy sites and at the top of the saphenopopliteal junction.  Blood loss was less than 15 cc.  The patient ambulated out of the operating room having tolerated the procedure well.

## 2012-07-07 ENCOUNTER — Telehealth: Payer: Self-pay | Admitting: *Deleted

## 2012-07-07 ENCOUNTER — Encounter: Payer: Self-pay | Admitting: Vascular Surgery

## 2012-07-07 NOTE — Telephone Encounter (Signed)
Reached patient at home. Doing well. No problems. Following all instructions. Reminded her of her fu visits next week.

## 2012-07-13 ENCOUNTER — Encounter: Payer: Self-pay | Admitting: Vascular Surgery

## 2012-07-13 ENCOUNTER — Ambulatory Visit: Payer: Medicare Other | Admitting: Vascular Surgery

## 2012-07-14 ENCOUNTER — Encounter: Payer: Self-pay | Admitting: Vascular Surgery

## 2012-07-14 ENCOUNTER — Ambulatory Visit (INDEPENDENT_AMBULATORY_CARE_PROVIDER_SITE_OTHER): Payer: Medicare Other | Admitting: Vascular Surgery

## 2012-07-14 ENCOUNTER — Encounter (INDEPENDENT_AMBULATORY_CARE_PROVIDER_SITE_OTHER): Payer: Medicare Other | Admitting: *Deleted

## 2012-07-14 VITALS — BP 142/59 | HR 63 | Resp 16 | Ht 64.0 in | Wt 121.0 lb

## 2012-07-14 DIAGNOSIS — R609 Edema, unspecified: Secondary | ICD-10-CM

## 2012-07-14 DIAGNOSIS — Z48812 Encounter for surgical aftercare following surgery on the circulatory system: Secondary | ICD-10-CM

## 2012-07-14 DIAGNOSIS — I83893 Varicose veins of bilateral lower extremities with other complications: Secondary | ICD-10-CM

## 2012-07-14 NOTE — Progress Notes (Signed)
Subjective:     Patient ID: Rachel Spencer, female   DOB: [birth year], 78 y.o.   MRN: [id number]  HPI this 78 year old female returns 1 week post laser ablation of the left small saphenous vein for painful varicosities she has had some mild-to-moderate discomfort in the posterior calf as one would expect but no change in distal edema. She has worn elastic compression stocking as instructed. She has had no chest pain, dyspnea on exertion, PND, orthopnea, or hemoptysis.  Past Medical History  Diagnosis Date  . Varicose veins     History  Substance Use Topics  . Smoking status: Never Smoker   . Smokeless tobacco: Never Used  . Alcohol Use: No    Family History  Problem Relation Age of Onset  . Heart disease Mother   . Hypertension Mother   . Heart disease Father   . Hyperlipidemia Father   . Hypertension Father     No Known Allergies  Current outpatient prescriptions:acetaminophen (TYLENOL) 325 MG tablet, Take 650 mg by mouth every 6 (six) hours as needed., Disp: , Rfl: ;  Multiple Vitamins-Minerals (CENTRUM SILVER PO), Take by mouth., Disp: , Rfl: ;  topiramate (TOPAMAX) 200 MG tablet, Take 200 mg by mouth 2 (two) times daily., Disp: , Rfl:   BP 142/59  Pulse 63  Resp 16  Ht 5\' 4"  (1.626 m)  Wt 121 lb (54.885 kg)  BMI 20.77 kg/m2  SpO2 100%  Body mass index is 20.77 kg/(m^2).          Review of Systems     Objective:   Physical Exam blood pressure 140/59 heart rate 63 respirations 16 General well-developed well-nourished female in no apparent distress alert and oriented x3 Lungs no rhonchi or wheezing Left lower extremity with 3+ dorsalis pedis pulse palpable. There is chronic hyperpigmentation in the lower third of the left leg with some bulging varicosities proximally. Mild to moderate tenderness along the course of the left small saphenous vein of the ordinary.  Today I ordered a venous duplex exam of the left leg which are reviewed and interpreted. Left GSB is  closed. The left SSV is closed. It communicates with the left gastrocnemius vein which has some thrombus at the junction. There appears to be some thickening of the valve or partial thrombus on the wall of the popliteal vein which is nonocclusive.     Assessment:     Successful laser ablation left small saphenous vein with some thrombus at junction of small saphenous vein and gastrocnemius vein and partial nonocclusive thrombus in popliteal vein possibly    Plan:     Will treat patient with Coumadin for 3 months to avoid DVT. Patient will continue elastic compression stockings return in 3 months to repeat venous duplex exam of left leg and see if stab phlebectomy is indicated If she develops worsening edema or chest pain or shortness of breath she will report to emergency department.

## 2012-07-15 NOTE — Addendum Note (Signed)
Addended by: Merri Ray A on: 07/15/2012 11:22 AM   Modules accepted: Orders

## 2012-10-12 ENCOUNTER — Encounter: Payer: Self-pay | Admitting: Vascular Surgery

## 2012-10-13 ENCOUNTER — Ambulatory Visit (INDEPENDENT_AMBULATORY_CARE_PROVIDER_SITE_OTHER): Payer: Medicare Other | Admitting: Vascular Surgery

## 2012-10-13 ENCOUNTER — Encounter: Payer: Self-pay | Admitting: Vascular Surgery

## 2012-10-13 VITALS — BP 116/64 | HR 63 | Resp 16 | Ht 64.0 in | Wt 125.0 lb

## 2012-10-13 DIAGNOSIS — M7989 Other specified soft tissue disorders: Secondary | ICD-10-CM

## 2012-10-13 DIAGNOSIS — Z48812 Encounter for surgical aftercare following surgery on the circulatory system: Secondary | ICD-10-CM

## 2012-10-13 DIAGNOSIS — M79609 Pain in unspecified limb: Secondary | ICD-10-CM

## 2012-10-13 DIAGNOSIS — I80299 Phlebitis and thrombophlebitis of other deep vessels of unspecified lower extremity: Secondary | ICD-10-CM

## 2012-10-13 DIAGNOSIS — I83893 Varicose veins of bilateral lower extremities with other complications: Secondary | ICD-10-CM

## 2012-10-13 DIAGNOSIS — I824Z9 Acute embolism and thrombosis of unspecified deep veins of unspecified distal lower extremity: Secondary | ICD-10-CM

## 2012-10-13 NOTE — Progress Notes (Signed)
Subjective:     Patient ID: Rachel Spencer, female   DOB: 1934-05-30, 78 y.o.   MRN: [id number]  HPI this 78 year old female returns for further followup regarding her venous insufficiency of the left leg. She had a partial DVT in the left popliteal vein and has been on Coumadin therapy for the past 3 months. She had successful ablation of the left great saphenous and the left small saphenous but did develop some bulging of thrombus in the popliteal vein. She has been wearing elastic compression stockings and has had no pain or swelling in the left leg over the past 3 months. Her Coumadin has been somewhat difficult to manage by Dr. Zachery Dauer. She has had no chest pain or hemoptysis.  Past Medical History  Diagnosis Date  . Varicose veins     History  Substance Use Topics  . Smoking status: Never Smoker   . Smokeless tobacco: Never Used  . Alcohol Use: No    Family History  Problem Relation Age of Onset  . Heart disease Mother   . Hypertension Mother   . Heart disease Father   . Hyperlipidemia Father   . Hypertension Father     No Known Allergies  Current outpatient prescriptions:acetaminophen (TYLENOL) 325 MG tablet, Take 650 mg by mouth every 6 (six) hours as needed., Disp: , Rfl: ;  Multiple Vitamins-Minerals (CENTRUM SILVER PO), Take by mouth., Disp: , Rfl: ;  topiramate (TOPAMAX) 200 MG tablet, Take 200 mg by mouth 2 (two) times daily., Disp: , Rfl:   BP 116/64  Pulse 63  Resp 16  Ht 5\' 4"  (1.626 m)  Wt 125 lb (56.7 kg)  BMI 21.46 kg/m2  Body mass index is 21.46 kg/(m^2).           Review of Systems denies chest pain, hemoptysis, dyspnea on exertion, PND, orthopnea.     Objective:   Physical Exam blood pressure 160/64 heart rate 63 respirations 16 General well-developed well-nourished female in no apparent stress alert and oriented x3 Lungs no rhonchi or wheezing Left lower extremity with bulging varicosities below the knee and great saphenous system medially  and hyperpigmentation lower third of the leg with no active ulcer. 3 posterior cells pedis pulse palpable. No significant unilateral edema noted. No calf tenderness noted.  Today I ordered a venous duplex exam the left leg which are reviewed and interpreted. The left great and small saphenous veins are totally close from the ablation. There continues to be some nonoccluding thrombus at the junction of the left popliteal vein with one gastrocnemius vein. This is unchanged from previous study 3 months ago.     Assessment:     Successful laser ablation left great and small saphenous veins with some nonoccluding thrombus persistent in left popliteal vein at the junction of the gastrocnemius vein    Plan:     Would recommend 6 more months of Coumadin therapy I will see patient in 6 months with repeat venous duplex exam at that time and will make a decision regarding discontinuing Coumadin

## 2012-10-13 NOTE — Progress Notes (Signed)
Left lower extremity venous duplex performed @ [facility] 10/13/2012

## 2012-11-16 ENCOUNTER — Other Ambulatory Visit: Payer: Self-pay | Admitting: *Deleted

## 2012-11-16 DIAGNOSIS — I83893 Varicose veins of bilateral lower extremities with other complications: Secondary | ICD-10-CM

## 2013-04-12 ENCOUNTER — Encounter: Payer: Self-pay | Admitting: Vascular Surgery

## 2013-04-13 ENCOUNTER — Encounter (INDEPENDENT_AMBULATORY_CARE_PROVIDER_SITE_OTHER): Payer: Medicare Other | Admitting: *Deleted

## 2013-04-13 ENCOUNTER — Encounter: Payer: Self-pay | Admitting: Vascular Surgery

## 2013-04-13 ENCOUNTER — Ambulatory Visit (INDEPENDENT_AMBULATORY_CARE_PROVIDER_SITE_OTHER): Payer: Medicare Other | Admitting: Vascular Surgery

## 2013-04-13 VITALS — BP 151/77 | HR 56 | Resp 16 | Ht 64.0 in | Wt 125.0 lb

## 2013-04-13 DIAGNOSIS — I83893 Varicose veins of bilateral lower extremities with other complications: Secondary | ICD-10-CM

## 2013-04-13 DIAGNOSIS — I80299 Phlebitis and thrombophlebitis of other deep vessels of unspecified lower extremity: Secondary | ICD-10-CM

## 2013-04-13 DIAGNOSIS — I82409 Acute embolism and thrombosis of unspecified deep veins of unspecified lower extremity: Secondary | ICD-10-CM | POA: Insufficient documentation

## 2013-04-13 DIAGNOSIS — I82402 Acute embolism and thrombosis of unspecified deep veins of left lower extremity: Secondary | ICD-10-CM

## 2013-04-13 NOTE — Progress Notes (Signed)
Subjective:     Patient ID: Rachel Spencer, female   DOB: [birth year], 79 y.o.   MRN: [id number]  HPI this 79 year old female returns for continued followup regarding her venous thrombosis in the gastrocnemius vein in the left leg. She had successful laser ablation of the left great and small saphenous veins. She did have some bulging of thrombus into the popliteal vein it has been treated with Coumadin for 9 months. She's had no swelling or pain in the left leg. The ulcer at the left ankle has completely healed.  Past Medical History  Diagnosis Date  . Varicose veins   . DVT (deep venous thrombosis)     History  Substance Use Topics  . Smoking status: Never Smoker   . Smokeless tobacco: Never Used  . Alcohol Use: No    Family History  Problem Relation Age of Onset  . Heart disease Mother   . Hypertension Mother   . Heart disease Father   . Hyperlipidemia Father   . Hypertension Father     No Known Allergies  Current outpatient prescriptions:warfarin (COUMADIN) 5 MG tablet, Take 10 mg by mouth daily., Disp: , Rfl: ;  acetaminophen (TYLENOL) 325 MG tablet, Take 650 mg by mouth every 6 (six) hours as needed., Disp: , Rfl: ;  Multiple Vitamins-Minerals (CENTRUM SILVER PO), Take by mouth., Disp: , Rfl: ;  topiramate (TOPAMAX) 200 MG tablet, Take 200 mg by mouth 2 (two) times daily., Disp: , Rfl:   BP 151/77  Pulse 56  Resp 16  Ht 5\' 4"  (1.626 m)  Wt 125 lb (56.7 kg)  BMI 21.45 kg/m2  Body mass index is 21.45 kg/(m^2).          Review of Systems denies chest pain, dyspnea on exertion, PND, orthopnea, hemoptysis    Objective:   Physical Exam blood pressure 151/77 heart rate 56 respirations 16 General well-developed well-nourished female in no apparent stress alert and oriented x3 Left lower extremity with 3+ femoral and dorsalis pedis pulse palpable. There severe hyperpigmentation lower third of the leg with a healed ulcer near the medial malleolus a small eschar. No  distal edema noted. There are some varicosities in the calf but not tightly bulging.  Today I ordered a venous duplex exam the left leg which are reviewed and interpreted. The popliteal vein is now widely patent with Rachel Spencer date of thrombus in this area. 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 Zachery Dauer Return to see [place] on a when necessary basis Recommended chronic use of short leg elastic compression stockings

## 2013-05-19 ENCOUNTER — Encounter (HOSPITAL_BASED_OUTPATIENT_CLINIC_OR_DEPARTMENT_OTHER): Payer: Medicare Other | Attending: General Surgery

## 2013-05-19 DIAGNOSIS — L299 Pruritus, unspecified: Secondary | ICD-10-CM | POA: Insufficient documentation

## 2013-05-19 DIAGNOSIS — I8000 Phlebitis and thrombophlebitis of superficial vessels of unspecified lower extremity: Secondary | ICD-10-CM | POA: Insufficient documentation

## 2013-05-20 NOTE — Progress Notes (Signed)
[facility]  NAME:  Rachel Spencer, Rachel Spencer NO.:  [id number]  MEDICAL RECORD NO.:  [id number]      DATE OF BIRTH:  19-Oct-1934  PHYSICIAN:  Ardath Sax, M.D.      VISIT DATE:  05/19/2013                                  OFFICE VISIT   This is a 79 year old Caucasian female who has a history of deep thrombophlebitis in her left leg.  She has had vein stripping and also more recently has had laser ablation of the left great and small saphenous veins.  She had some superficial phlebitis after this treated with Coumadin.  She has just gone off the Coumadin after 9 months of therapy.  She otherwise is a fairly healthy lady.  She has normal vital signs.  Her blood pressure was 127/76, respirations 16, pulse 70.  She weighs 113 pounds.  Her temperature is 97.6.  She is only on multivitamins.  No allergies.  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  I just advised her to use her support hose 20-30 pressure.  She also was complaining of some itching on portions of her skin, and I gave her a prescription for triamcinolone cream.  I told her to come back here p.r.n.     Ardath Sax, M.D.     PP/MEDQ  D:  05/19/2013  T:  05/20/2013  Job:  [id number]

## 2014-11-09 ENCOUNTER — Ambulatory Visit: Payer: Medicare Other | Attending: Orthopedic Surgery | Admitting: Physical Therapy

## 2014-11-09 DIAGNOSIS — M7072 Other bursitis of hip, left hip: Secondary | ICD-10-CM | POA: Diagnosis not present

## 2014-11-09 DIAGNOSIS — M25552 Pain in left hip: Secondary | ICD-10-CM | POA: Insufficient documentation

## 2014-11-09 DIAGNOSIS — M75101 Unspecified rotator cuff tear or rupture of right shoulder, not specified as traumatic: Secondary | ICD-10-CM | POA: Insufficient documentation

## 2014-11-09 DIAGNOSIS — M25511 Pain in right shoulder: Secondary | ICD-10-CM | POA: Diagnosis not present

## 2014-11-14 ENCOUNTER — Ambulatory Visit: Payer: Medicare Other | Admitting: Physical Therapy

## 2014-11-14 DIAGNOSIS — M25511 Pain in right shoulder: Secondary | ICD-10-CM | POA: Diagnosis not present

## 2014-11-16 ENCOUNTER — Ambulatory Visit: Payer: Medicare Other | Admitting: Physical Therapy

## 2014-11-16 DIAGNOSIS — M25511 Pain in right shoulder: Secondary | ICD-10-CM | POA: Diagnosis not present

## 2014-11-21 ENCOUNTER — Ambulatory Visit: Payer: Medicare Other | Admitting: Physical Therapy

## 2014-11-23 ENCOUNTER — Ambulatory Visit: Payer: Medicare Other | Admitting: Physical Therapy

## 2014-11-23 DIAGNOSIS — M25511 Pain in right shoulder: Secondary | ICD-10-CM | POA: Diagnosis not present

## 2014-11-25 ENCOUNTER — Ambulatory Visit: Payer: Medicare Other | Admitting: Physical Therapy

## 2014-11-25 DIAGNOSIS — M25511 Pain in right shoulder: Secondary | ICD-10-CM | POA: Diagnosis not present

## 2014-11-29 ENCOUNTER — Ambulatory Visit: Payer: Medicare Other | Attending: Orthopedic Surgery | Admitting: Physical Therapy

## 2014-11-29 DIAGNOSIS — M7072 Other bursitis of hip, left hip: Secondary | ICD-10-CM | POA: Insufficient documentation

## 2014-11-29 DIAGNOSIS — M75101 Unspecified rotator cuff tear or rupture of right shoulder, not specified as traumatic: Secondary | ICD-10-CM | POA: Diagnosis not present

## 2014-11-29 DIAGNOSIS — M25552 Pain in left hip: Secondary | ICD-10-CM | POA: Insufficient documentation

## 2014-11-29 DIAGNOSIS — M25511 Pain in right shoulder: Secondary | ICD-10-CM | POA: Diagnosis present

## 2014-11-30 ENCOUNTER — Ambulatory Visit: Payer: Medicare Other | Admitting: Physical Therapy

## 2014-11-30 DIAGNOSIS — M25511 Pain in right shoulder: Secondary | ICD-10-CM | POA: Diagnosis not present

## 2014-12-05 ENCOUNTER — Ambulatory Visit: Payer: Medicare Other | Admitting: Physical Therapy

## 2014-12-05 DIAGNOSIS — M25511 Pain in right shoulder: Secondary | ICD-10-CM | POA: Diagnosis not present

## 2014-12-07 ENCOUNTER — Ambulatory Visit: Payer: Medicare Other | Admitting: Physical Therapy

## 2014-12-07 DIAGNOSIS — M25511 Pain in right shoulder: Secondary | ICD-10-CM | POA: Diagnosis not present

## 2014-12-13 ENCOUNTER — Ambulatory Visit: Payer: Medicare Other | Admitting: Physical Therapy

## 2014-12-13 ENCOUNTER — Encounter: Payer: Self-pay | Admitting: Physical Therapy

## 2014-12-13 DIAGNOSIS — M25511 Pain in right shoulder: Secondary | ICD-10-CM | POA: Diagnosis not present

## 2014-12-13 DIAGNOSIS — M25552 Pain in left hip: Secondary | ICD-10-CM

## 2014-12-13 NOTE — Therapy (Signed)
[facility] [address] [place], [place], [address] Phone: [phone]   Fax:  [phone]  Physical Therapy Treatment  Patient Details  Name: Rachel ChannelMartha D Stoltzfus MRN: [id number] Date of Birth: 04/19/1934 Referring Provider:  Marthe PatchBarnes, Elizabeth Stewa*  Encounter Date: 12/13/2014      PT End of Session - 12/13/14 1632    Visit Number 10   PT Start Time 1630   PT Stop Time 1740   PT Time Calculation (min) 70 min   Activity Tolerance Patient tolerated treatment well;Patient limited by pain      Past Medical History  Diagnosis Date  . Varicose veins   . DVT (deep venous thrombosis)   . Hyperlipidemia   . Esophageal reflux     Past Surgical History  Procedure Laterality Date  . Appendectomy    . Skin grafting      on ulcers left ankle  . Vein ligation and stripping      There were no vitals taken for this visit.  Visit Diagnosis:  Hip pain, left  Pain in joint, shoulder region, right      Subjective Assessment - 12/13/14 1638    Symptoms some pain in th eright shoulder and in the left hip   Pertinent History has had some pain for about a year, she has responded will with PT but will flare up with her doing housework and yardwork   Limitations Sitting;Lifting;Standing;Walking;House hold activities   Currently in Pain? Yes   Pain Score 3    Pain Location Hip   Pain Orientation Left   Pain Descriptors / Indicators Aching   Pain Type Chronic pain   Pain Onset More than a month ago   Pain Frequency Constant   Aggravating Factors  household activities, lifting grandchildren   Pain Relieving Factors heat and PT treatment helps   Effect of Pain on Daily Activities "just limited and fearful"   Multiple Pain Sites Yes                    [facility] Adult PT Treatment/Exercise - 12/13/14 0001    Modalities   Modalities Electrical Stimulation;Moist Heat;Ultrasound    Treatment included:  Feet on ball  lumbar mobility and stability exercises Hip isometric exercises Yellow and red t-band shoulder RC exercises Supine shoulder and lumbar stability exercises                  PT Long Term Goals - 12/13/14 1723    PT LONG TERM GOAL #1   Title be independent with HEP   Time 6   Period Weeks   Status New   PT LONG TERM GOAL #2   Title report pain decreased 50%   Time 6   Period Weeks   Status New   PT LONG TERM GOAL #3   Title understand proper posture and body mechanics for ADL's   Time 6   Period Weeks   Status New               Plan - 12/13/14 1720    Clinical Impression Statement Patient has done well with PT however she has had some increased pain after her lifting a grandchild and with trying to do housework and yardwork.  She c/o pain with ADL's and that is her biggest issue   Pt will benefit from skilled therapeutic intervention in order to improve on the following deficits Decreased activity tolerance;Decreased range of motion;Increased muscle spasms;Impaired  flexibility;Improper body mechanics;Pain   Rehab Potential Good   PT Frequency 2x / week   PT Duration 4 weeks   PT Treatment/Interventions Electrical Stimulation;Moist Heat;Ultrasound;Therapeutic exercise;Manual techniques;Patient/family education   PT Next Visit Plan focus on stabilization of the shoulder and lumbar    Consulted and Agree with Plan of Care Patient          G-Codes - 01/12/2015 1726    Functional Assessment Tool Used FOTO   Functional Limitation Other PT primary   Other PT Primary Current Status (Z6109) At least 20 percent but less than 40 percent impaired, limited or restricted   Other PT Primary Goal Status (U0454) At least 40 percent but less than 60 percent impaired, limited or restricted      Problem List Patient Active Problem List   Diagnosis Date Noted  . DVT (deep venous thrombosis) 04/13/2013  . Acute venous embolism and thrombosis of deep vessels of distal lower  extremity 10/13/2012  . Varicose veins of lower extremities with other complications 01/27/2012    Rachel Spencer, PT 01/12/15, 5:29 PM  [facility] [place] [facility] [address] [place], [place], [address] Phone: [phone]   Fax:  [phone]

## 2014-12-15 ENCOUNTER — Ambulatory Visit: Payer: Medicare Other | Admitting: Physical Therapy

## 2014-12-15 DIAGNOSIS — M25511 Pain in right shoulder: Secondary | ICD-10-CM

## 2014-12-15 DIAGNOSIS — M545 Low back pain, unspecified: Secondary | ICD-10-CM

## 2014-12-15 NOTE — Therapy (Signed)
[facility] [address] [place], [place], [address] Phone: [phone]   Fax:  [phone]  Physical Therapy Treatment  Patient Details  Name: Rachel Spencer MRN: [id number] Date of Birth: 02/17/1934 Referring Provider:  Marthe PatchBarnes, Elizabeth Stewa*  Encounter Date: 12/15/2014      PT End of Session - 12/15/14 1720    Visit Number 11   PT Start Time 1630   PT Stop Time 1730   PT Time Calculation (min) 60 min   Activity Tolerance Patient tolerated treatment well;Patient limited by pain      Past Medical History  Diagnosis Date  . Varicose veins   . DVT (deep venous thrombosis)   . Hyperlipidemia   . Esophageal reflux     Past Surgical History  Procedure Laterality Date  . Appendectomy    . Skin grafting      on ulcers left ankle  . Vein ligation and stripping      There were no vitals taken for this visit.  Visit Diagnosis:  Right shoulder pain  Left-sided low back pain without sciatica      Subjective Assessment - 12/15/14 1714    Symptoms reports did great after treatment two days ago until last night, then increased pain, unsure of cause   Currently in Pain? Yes   Pain Score 5    Pain Location Hip   Pain Orientation Left   Pain Descriptors / Indicators Aching   Pain Onset More than a month ago   Pain Frequency Constant   Aggravating Factors  unsure, reports increase of pain trying to sleep in bed   Pain Relieving Factors PT treatment   Multiple Pain Sites Yes   Pain Score 4   Pain Type Chronic pain   Pain Location Shoulder   Pain Orientation Right   Pain Descriptors / Indicators Aching   Pain Frequency Occasional                    [facility] Adult PT Treatment/Exercise - 12/15/14 0001    Moist Heat Therapy   Number Minutes Moist Heat 15 Minutes   Moist Heat Location --  low back and right shoulder   Electrical Stimulation   Electrical Stimulation Location --  left SI and right  shoulder   Electrical Stimulation Goals Pain   Ultrasound   Ultrasound Location --  right shoulder and left SI area   Ultrasound Goals Pain   Manual Therapy   Manual Therapy Myofascial release   Myofascial Release right shoulder and left SI area                     PT Long Term Goals - 12/13/14 1723    PT LONG TERM GOAL #1   Title be independent with HEP   Time 6   Period Weeks   Status New   PT LONG TERM GOAL #2   Title report pain decreased 50%   Time 6   Period Weeks   Status New   PT LONG TERM GOAL #3   Title understand proper posture and body mechanics for ADL's   Time 6   Period Weeks   Status New               Plan - 12/15/14 1721    Pt will benefit from skilled therapeutic intervention in order to improve on the following deficits Decreased activity tolerance;Decreased range of motion;Increased muscle spasms;Impaired flexibility;Improper body  mechanics;Pain   Rehab Potential Good   PT Frequency 2x / week   PT Duration 4 weeks   PT Treatment/Interventions Electrical Stimulation;Moist Heat;Ultrasound;Therapeutic exercise;Manual techniques;Patient/family education   PT Next Visit Plan focus on stabilization of the shoulder and lumbar    Consulted and Agree with Plan of Care Patient        Problem List Patient Active Problem List   Diagnosis Date Noted  . DVT (deep venous thrombosis) 04/13/2013  . Acute venous embolism and thrombosis of deep vessels of distal lower extremity 10/13/2012  . Varicose veins of lower extremities with other complications 01/27/2012    Jearld Lesch, PT 12/15/2014, 5:23 PM  [facility] [place] [facility] [address] [place], [place], [address] Phone: [phone]   Fax:  [phone]

## 2014-12-20 ENCOUNTER — Ambulatory Visit: Payer: Medicare Other | Admitting: Physical Therapy

## 2014-12-20 ENCOUNTER — Encounter: Payer: Self-pay | Admitting: Physical Therapy

## 2014-12-20 DIAGNOSIS — M545 Low back pain, unspecified: Secondary | ICD-10-CM

## 2014-12-20 DIAGNOSIS — M25511 Pain in right shoulder: Secondary | ICD-10-CM | POA: Diagnosis not present

## 2014-12-20 DIAGNOSIS — M25552 Pain in left hip: Secondary | ICD-10-CM

## 2014-12-20 NOTE — Therapy (Signed)
[facility] [place] [facility] [address] [place], [place], [address] Phone: [phone]   Fax:  [phone]  Physical Therapy Treatment  Patient Details  Name: Rachel Spencer MRN: [id number] Date of Birth: [birth year] Referring Provider:  Budd Palmer, MD  Encounter Date: 12/20/2014      PT End of Session - 12/20/14 1517    Visit Number 12   PT Start Time 1420   PT Stop Time 1525   PT Time Calculation (min) 65 min   Activity Tolerance Patient tolerated treatment well      Past Medical History  Diagnosis Date  . Varicose veins   . DVT (deep venous thrombosis)   . Hyperlipidemia   . Esophageal reflux     Past Surgical History  Procedure Laterality Date  . Appendectomy    . Skin grafting      on ulcers left ankle  . Vein ligation and stripping      There were no vitals taken for this visit.  Visit Diagnosis:  Right shoulder pain  Left-sided low back pain without sciatica  Hip pain, left  Pain in joint, shoulder region, right      Subjective Assessment - 12/20/14 1421    Symptoms Tried to drive Sunday and yesterday, caused extra pain.   Pertinent History has had some pain for about a year, she has responded will with PT but will flare up with her doing housework and yardwork   Currently in Pain? No/denies   Aggravating Factors  driving, laying in the bed   Multiple Pain Sites No                    [facility] Adult PT Treatment/Exercise - 12/20/14 0001    Modalities   Modalities Ultrasound;Moist Heat;Electrical Stimulation   Moist Heat Therapy   Number Minutes Moist Heat 15 Minutes   Moist Heat Location Other (comment)  right shld/trap; lumbar/sacral   Electrical Stimulation   Electrical Stimulation Location left SI; right shoulder   Electrical Stimulation Parameters premod   Electrical Stimulation Goals Pain   Ultrasound   Ultrasound Location left SI   Ultrasound Goals Pain   Manual Therapy   Manual Therapy Myofascial release   Myofascial Release left glute                PT Education - 12/20/14 1531    Education provided Yes   Education Details discussed patient's symptoms and inability to sleep in a bed.  Discussed the use of pillows and pressure relief.   Person(s) Educated Patient   Methods Explanation   Comprehension Verbalized understanding             PT Long Term Goals - 12/13/14 1723    PT LONG TERM GOAL #1   Title be independent with HEP   Time 6   Period Weeks   Status New   PT LONG TERM GOAL #2   Title report pain decreased 50%   Time 6   Period Weeks   Status New   PT LONG TERM GOAL #3   Title understand proper posture and body mechanics for ADL's   Time 6   Period Weeks   Status New               Plan - 12/20/14 1511    Clinical Impression Statement Palpated a knot in left glute proximal to SI.  Tight right trap.   Pt will benefit from skilled  therapeutic intervention in order to improve on the following deficits Decreased activity tolerance;Decreased range of motion;Increased muscle spasms;Impaired flexibility;Improper body mechanics;Pain   Rehab Potential Good   PT Frequency 2x / week   PT Duration 4 weeks   PT Treatment/Interventions Electrical Stimulation;Moist Heat;Ultrasound;Therapeutic exercise;Manual techniques;Patient/family education   PT Next Visit Plan Assess effects of latest tx. Maybe try combo to decrease tightness.   Consulted and Agree with Plan of Care Patient        Problem List Patient Active Problem List   Diagnosis Date Noted  . DVT (deep venous thrombosis) 04/13/2013  . Acute venous embolism and thrombosis of deep vessels of distal lower extremity 10/13/2012  . Varicose veins of lower extremities with other complications 01/27/2012    Nyheim Seufert  PTA 12/20/2014, 3:33 PM  [facility] [place] [facility] [address] [place], [place], [address] Phone:  [phone]   Fax:  [phone]

## 2014-12-22 ENCOUNTER — Encounter: Payer: Self-pay | Admitting: Physical Therapy

## 2014-12-22 ENCOUNTER — Ambulatory Visit: Payer: Medicare Other | Admitting: Physical Therapy

## 2014-12-22 DIAGNOSIS — M545 Low back pain, unspecified: Secondary | ICD-10-CM

## 2014-12-22 DIAGNOSIS — M25511 Pain in right shoulder: Secondary | ICD-10-CM | POA: Diagnosis not present

## 2014-12-22 NOTE — Therapy (Signed)
[facility] [address] [place], [place], [address] Phone: [phone]   Fax:  [phone]  Physical Therapy Treatment  Patient Details  Name: Rachel Spencer MRN: [id number] Date of Birth: 12/24/1933 Referring Provider:  Budd PalmerHandy, Cordarrius Coad H, MD  Encounter Date: 12/22/2014      PT End of Session - 12/22/14 1704    Visit Number 13   PT Start Time 1625   PT Stop Time 1730   PT Time Calculation (min) 65 min      Past Medical History  Diagnosis Date  . Varicose veins   . DVT (deep venous thrombosis)   . Hyperlipidemia   . Esophageal reflux     Past Surgical History  Procedure Laterality Date  . Appendectomy    . Skin grafting      on ulcers left ankle  . Vein ligation and stripping      There were no vitals taken for this visit.  Visit Diagnosis:  Right shoulder pain  Left-sided low back pain without sciatica      Subjective Assessment - 12/22/14 1640    Symptoms She saw MD yesterday, recieved cortisone injection in the SI area, reports sitting in church on Sunday she had increased right shoulder pain   Pertinent History has had some pain for about a year, she has responded will with PT but will flare up with her doing housework and yardwork   Limitations Sitting;Lifting;Standing;Walking;House hold activities   Currently in Pain? Yes   Pain Score 5    Pain Location Shoulder   Pain Orientation Right   Pain Descriptors / Indicators Aching   Pain Type Chronic pain   Pain Onset More than a month ago                    [facility] Adult PT Treatment/Exercise - 12/22/14 0001    Moist Heat Therapy   Number Minutes Moist Heat 15 Minutes   Moist Heat Location Shoulder   Electrical Stimulation   Electrical Stimulation Location --  right shoulder and left SI   Electrical Stimulation Parameters premod   Electrical Stimulation Goals Pain   Ultrasound   Ultrasound Location right shoudlder and left SI    Ultrasound Goals Pain   Manual Therapy   Manual Therapy Myofascial release   Myofascial Release right upper trap area                PT Education - 12/22/14 1703    Education provided Yes   Education Details Red Tband scapular stabilization and RC strength exercises   Person(s) Educated Patient   Methods Explanation;Demonstration;Handout   Comprehension Verbalized understanding;Returned demonstration;Verbal cues required             PT Long Term Goals - 12/13/14 1723    PT LONG TERM GOAL #1   Title be independent with HEP   Time 6   Period Weeks   Status New   PT LONG TERM GOAL #2   Title report pain decreased 50%   Time 6   Period Weeks   Status New   PT LONG TERM GOAL #3   Title understand proper posture and body mechanics for ADL's   Time 6   Period Weeks   Status New               Plan - 12/22/14 1705    Clinical Impression Statement tight in the right upper trap and into the right  shoulder, pain with sitting in church, had cortisone injection yesterday in the left SI   Pt will benefit from skilled therapeutic intervention in order to improve on the following deficits Decreased activity tolerance;Decreased range of motion;Increased muscle spasms;Impaired flexibility;Improper body mechanics;Pain   Rehab Potential Good   PT Frequency 2x / week   PT Duration 4 weeks   PT Treatment/Interventions Electrical Stimulation;Moist Heat;Ultrasound;Therapeutic exercise;Manual techniques;Patient/family education   PT Next Visit Plan May add more exercise as tolerated   Consulted and Agree with Plan of Care Patient        Problem List Patient Active Problem List   Diagnosis Date Noted  . DVT (deep venous thrombosis) 04/13/2013  . Acute venous embolism and thrombosis of deep vessels of distal lower extremity 10/13/2012  . Varicose veins of lower extremities with other complications 01/27/2012    Jearld Lesch, PT 12/22/2014, 5:07 PM  [facility] [place] [facility] [address] [place], [place], [address] Phone: [phone]   Fax:  [phone]

## 2014-12-27 ENCOUNTER — Encounter: Payer: Self-pay | Admitting: Physical Therapy

## 2014-12-27 ENCOUNTER — Ambulatory Visit: Payer: Medicare Other | Attending: Orthopedic Surgery | Admitting: Physical Therapy

## 2014-12-27 DIAGNOSIS — M545 Low back pain, unspecified: Secondary | ICD-10-CM

## 2014-12-27 DIAGNOSIS — M7072 Other bursitis of hip, left hip: Secondary | ICD-10-CM | POA: Insufficient documentation

## 2014-12-27 DIAGNOSIS — M25511 Pain in right shoulder: Secondary | ICD-10-CM | POA: Diagnosis present

## 2014-12-27 DIAGNOSIS — M75101 Unspecified rotator cuff tear or rupture of right shoulder, not specified as traumatic: Secondary | ICD-10-CM | POA: Insufficient documentation

## 2014-12-27 DIAGNOSIS — M25552 Pain in left hip: Secondary | ICD-10-CM | POA: Diagnosis not present

## 2014-12-27 NOTE — Therapy (Signed)
[facility] [address] [place], [place], [address] Phone: [phone]   Fax:  [phone]  Physical Therapy Treatment  Patient Details  Name: Rachel ChannelMartha D Disch MRN: [id number] Date of Birth: 06/01/1934 Referring Provider:  Budd PalmerHandy, Zyia Kaneko H, MD  Encounter Date: 12/27/2014      PT End of Session - 12/27/14 1458    Visit Number 14   Date for PT Re-Evaluation 01/19/15   PT Start Time 1419   PT Stop Time 1520   PT Time Calculation (min) 61 min   Activity Tolerance Patient tolerated treatment well      Past Medical History  Diagnosis Date  . Varicose veins   . DVT (deep venous thrombosis)   . Hyperlipidemia   . Esophageal reflux     Past Surgical History  Procedure Laterality Date  . Appendectomy    . Skin grafting      on ulcers left ankle  . Vein ligation and stripping      There were no vitals taken for this visit.  Visit Diagnosis:  Right shoulder pain  Left-sided low back pain without sciatica  Hip pain, left      Subjective Assessment - 12/27/14 1419    Symptoms I am a little sore right now from moving my plants but I had been doing really well all weekend   Pertinent History has had some pain for about a year, she has responded will with PT but will flare up with her doing housework and yardwork   Limitations Sitting;Lifting;Standing;Walking;House hold activities   Currently in Pain? Yes   Pain Score 3    Pain Location Hip   Pain Orientation Left   Pain Descriptors / Indicators Aching;Nagging   Pain Onset More than a month ago   Pain Frequency Intermittent                    [facility] Adult PT Treatment/Exercise - 12/27/14 0001    Posture/Postural Control   Posture Comments Went over proper posture and how to try and maintain to decrease stress on shoulder and back   Lumbar Exercises: Stretches   Passive Hamstring Stretch 5 reps;10 seconds  passive bilateral   Single Knee to Chest  Stretch 5 reps;10 seconds  bilateral   Double Knee to Chest Stretch 5 reps;10 seconds   Lower Trunk Rotation 5 reps;10 seconds   Pelvic Tilt 5 reps;10 seconds   ITB Stretch 3 reps;20 seconds   Lumbar Exercises: Supine   Glut Set 5 reps;4 seconds   Clam 10 reps;2 seconds   Modalities   Modalities Ultrasound   Moist Heat Therapy   Number Minutes Moist Heat 15 Minutes   Moist Heat Location Shoulder   Electrical Stimulation   Electrical Stimulation Location --  left SI and right shoulder   Electrical Stimulation Parameters premod   Electrical Stimulation Goals Pain   Ultrasound   Ultrasound Location right shoulder and left SI   Ultrasound Goals Pain                     PT Long Term Goals - 12/13/14 1723    PT LONG TERM GOAL #1   Title be independent with HEP   Time 6   Period Weeks   Status New   PT LONG TERM GOAL #2   Title report pain decreased 50%   Time 6   Period Weeks   Status New   PT LONG  TERM GOAL #3   Title understand proper posture and body mechanics for ADL's   Time 6   Period Weeks   Status New               Plan - 12/27/14 1459    Clinical Impression Statement reports had a great weekend with less pain and increased ability.  She does report sore today due to moving her plants   Pt will benefit from skilled therapeutic intervention in order to improve on the following deficits Decreased activity tolerance;Decreased range of motion;Increased muscle spasms;Impaired flexibility;Improper body mechanics;Pain   Rehab Potential Good   PT Frequency 2x / week   PT Duration 4 weeks   PT Treatment/Interventions Electrical Stimulation;Moist Heat;Ultrasound;Therapeutic exercise;Manual techniques;Patient/family education   PT Next Visit Plan continue with the supine stabilization        Problem List Patient Active Problem List   Diagnosis Date Noted  . DVT (deep venous thrombosis) 04/13/2013  . Acute venous embolism and thrombosis of deep  vessels of distal lower extremity 10/13/2012  . Varicose veins of lower extremities with other complications 01/27/2012    Jearld Lesch, PT 12/27/2014, 3:01 PM  [facility] [place] [facility] [address] [place], [place], [address] Phone: [phone]   Fax:  [phone]

## 2014-12-30 ENCOUNTER — Ambulatory Visit: Payer: Medicare Other | Admitting: Physical Therapy

## 2014-12-30 ENCOUNTER — Encounter: Payer: Self-pay | Admitting: Physical Therapy

## 2014-12-30 DIAGNOSIS — M545 Low back pain, unspecified: Secondary | ICD-10-CM

## 2014-12-30 DIAGNOSIS — M25511 Pain in right shoulder: Secondary | ICD-10-CM

## 2014-12-30 NOTE — Therapy (Signed)
[address] [place], [place], [address] Phone: [phone]   Fax:  [phone]  Physical Therapy Treatment  Patient Details  Name: Rachel Spencer MRN: [id number] Date of Birth: [birth year] Referring Provider:  Rozanna Box, MD  Encounter Date: 12/30/2014      PT End of Session - 12/30/14 1133    Visit Number 15   Date for PT Re-Evaluation 01/19/15   PT Start Time 1058   PT Stop Time 1146   PT Time Calculation (min) 48 min   Activity Tolerance Patient tolerated treatment well      Past Medical History  Diagnosis Date  . Varicose veins   . DVT (deep venous thrombosis)   . Hyperlipidemia   . Esophageal reflux     Past Surgical History  Procedure Laterality Date  . Appendectomy    . Skin grafting      on ulcers left ankle  . Vein ligation and stripping      There were no vitals taken for this visit.  Visit Diagnosis:  Right shoulder pain  Left-sided low back pain without sciatica      Subjective Assessment - 12/30/14 1058    Symptoms I really have been doing better.  I have been doing some housework and I do not have any extra pain   Pertinent History has had some pain for about a year, she has responded will with PT but will flare up with her doing housework and yardwork   Limitations Sitting;Lifting;Standing;Walking;House hold activities   Currently in Pain? Yes   Pain Score 2    Pain Location Hip   Pain Orientation Left   Pain Descriptors / Indicators Aching;Sore   Pain Type Chronic pain   Pain Onset More than a month ago   Pain Frequency Intermittent   Aggravating Factors  housework   Pain Relieving Factors the [place] seems to help     Patient did not want to exercise today as she has "a lot of housework to do to get ready for company coming"               [facility] Adult PT Treatment/Exercise - 12/30/14 0001    Modalities   Modalities Ultrasound   Moist Heat Therapy   Number Minutes Moist Heat 15 Minutes   Moist Heat Location Shoulder  and left SI area   Electrical Stimulation   Electrical Stimulation Location left SI; right shoulder   Electrical Stimulation Parameters premod   Electrical Stimulation Goals Pain   Ultrasound   Ultrasound Location right shoulder   Ultrasound Goals Pain   Manual Therapy   Manual Therapy Myofascial release   Myofascial Release right upper trap area  and into the rhomboid                     PT Long Term Goals - 12/30/14 1135    PT LONG TERM GOAL #1   Title be independent with HEP   Status On-going   PT LONG TERM GOAL #2   Title report pain decreased 50%   Status Partially Met   PT LONG TERM GOAL #3   Title understand proper posture and body mechanics for ADL's   Status Partially Met               Plan - 12/30/14 1133    Clinical Impression Statement Improving with less pain and increased ability to perform ADL's without having difficutly.  Has a know in the right upper trap and in the left SI area that i stender   Pt will benefit from skilled therapeutic intervention in order to improve on the following deficits Decreased activity tolerance;Decreased range of motion;Increased muscle spasms;Impaired flexibility;Improper body mechanics;Pain   Rehab Potential Good   PT Frequency 2x / week   PT Duration 4 weeks   PT Treatment/Interventions Electrical Stimulation;Moist Heat;Ultrasound;Therapeutic exercise;Manual techniques;Patient/family education   Consulted and Agree with Plan of Care Patient        Problem List Patient Active Problem List   Diagnosis Date Noted  . DVT (deep venous thrombosis) 04/13/2013  . Acute venous embolism and thrombosis of deep vessels of distal lower extremity 10/13/2012  . Varicose veins of lower extremities with other complications 04/22/9484    Sumner Boast, PT 12/30/2014, 11:36 AM  [place] [address] [address] [place], [place], [address] Phone: [phone]   Fax:  [phone]

## 2015-01-02 ENCOUNTER — Ambulatory Visit: Payer: Medicare Other | Admitting: Physical Therapy

## 2015-01-02 ENCOUNTER — Encounter: Payer: Self-pay | Admitting: Physical Therapy

## 2015-01-02 DIAGNOSIS — M25511 Pain in right shoulder: Secondary | ICD-10-CM

## 2015-01-02 DIAGNOSIS — M545 Low back pain, unspecified: Secondary | ICD-10-CM

## 2015-01-02 NOTE — Therapy (Signed)
[address] [address] [place], [place], [address] Phone: [phone]   Fax:  [phone]  Physical Therapy Treatment  Patient Details  Name: Rachel Spencer MRN: [id number] Date of Birth: 06-18-1934 Referring Provider:  Altamese Algona, MD  Encounter Date: 01/02/2015      PT End of Session - 01/02/15 1352    Visit Number 16   Date for PT Re-Evaluation 01/19/15   PT Start Time 1315   PT Stop Time 1417   PT Time Calculation (min) 62 min      Past Medical History  Diagnosis Date  . Varicose veins   . DVT (deep venous thrombosis)   . Hyperlipidemia   . Esophageal reflux     Past Surgical History  Procedure Laterality Date  . Appendectomy    . Skin grafting      on ulcers left ankle  . Vein ligation and stripping      There were no vitals taken for this visit.  Visit Diagnosis:  Right shoulder pain  Left-sided low back pain without sciatica      Subjective Assessment - 01/02/15 1315    Symptoms I really over did it this weekend.  I was having company and I did so much that I was going to have someone help with me.   Pertinent History has had some pain for about a year, she has responded will with PT but will flare up with her doing housework and yardwork   Limitations Sitting;Lifting;Standing;Walking;House hold activities   Patient Stated Goals no pain with house and yard work   Currently in Pain? Yes   Pain Score 4    Pain Location Back   Pain Orientation Left   Pain Descriptors / Indicators Aching;Tender;Sore   Pain Type Chronic pain   Pain Onset More than a month ago   Aggravating Factors  housework   Pain Relieving Factors rest and treatment                    [facility] Adult PT Treatment/Exercise - 01/02/15 0001    Lumbar Exercises: Stretches   Passive Hamstring Stretch 5 reps;10 seconds   Single Knee to Chest Stretch 5 reps;10 seconds   Double Knee to Chest Stretch 5 reps;10  seconds   Lower Trunk Rotation 5 reps;10 seconds   Pelvic Tilt 5 reps;10 seconds   ITB Stretch 2 reps;30 seconds   Lumbar Exercises: Supine   Clam 10 reps;2 seconds   Modalities   Modalities Ultrasound   Moist Heat Therapy   Number Minutes Moist Heat 15 Minutes   Moist Heat Location Shoulder   Electrical Stimulation   Electrical Stimulation Location left SI; right shoulder   Electrical Stimulation Parameters premod   Electrical Stimulation Goals Pain   Ultrasound   Ultrasound Location right shoulder and left SI   Ultrasound Goals Pain   Manual Therapy   Manual Therapy Myofascial release   Myofascial Release right upper trap area  and into the rhomboid                     PT Long Term Goals - 01/02/15 1354    PT LONG TERM GOAL #1   Status Partially Met   PT LONG TERM GOAL #2   Status Partially Met   PT LONG TERM GOAL #3   Status Achieved               Plan - 01/02/15  [place] Patient reports that she was very active this weekedn getting ready for company.  She reports that she is very pleased that she was able to do so much without a large increase of pain   Pt will benefit from skilled therapeutic intervention in order to improve on the following deficits Decreased activity tolerance;Decreased range of motion;Increased muscle spasms;Impaired flexibility;Improper body mechanics;Pain   Rehab Potential Good   PT Frequency 2x / week   PT Duration 4 weeks   PT Treatment/Interventions Electrical Stimulation;Moist Heat;Ultrasound;Therapeutic exercise;Manual techniques;Patient/family education   PT Next Visit Plan continue with the supine stabilization   Consulted and Agree with Plan of Care Patient        Problem List Patient Active Problem List   Diagnosis Date Noted  . DVT (deep venous thrombosis) 04/13/2013  . Acute venous embolism and thrombosis of deep vessels of distal lower extremity 10/13/2012  . Varicose veins of  lower extremities with other complications 18/34/3735    Sumner Boast, PT 01/02/2015, 1:55 PM  [place] [address] [place], [place], [address] Phone: [phone]   Fax:  [phone]

## 2015-01-04 ENCOUNTER — Encounter: Payer: Self-pay | Admitting: Physical Therapy

## 2015-01-04 ENCOUNTER — Ambulatory Visit: Payer: Medicare Other | Admitting: Physical Therapy

## 2015-01-04 DIAGNOSIS — M545 Low back pain, unspecified: Secondary | ICD-10-CM

## 2015-01-04 DIAGNOSIS — M25511 Pain in right shoulder: Secondary | ICD-10-CM | POA: Diagnosis not present

## 2015-01-04 NOTE — Therapy (Signed)
[address] [place], [place], [address] Phone: [phone]   Fax:  [phone]  Physical Therapy Treatment  Patient Details  Name: Rachel Spencer MRN: [id number] Date of Birth: [birth year] Referring Provider:  Altamese Chester, MD  Encounter Date: 01/04/2015      PT End of Session - 01/04/15 1323    Visit Number 17   Date for PT Re-Evaluation 01/19/15   PT Start Time 1321   PT Stop Time 1420   PT Time Calculation (min) 59 min      Past Medical History  Diagnosis Date  . Varicose veins   . DVT (deep venous thrombosis)   . Hyperlipidemia   . Esophageal reflux     Past Surgical History  Procedure Laterality Date  . Appendectomy    . Skin grafting      on ulcers left ankle  . Vein ligation and stripping      There were no vitals taken for this visit.  Visit Diagnosis:  Right shoulder pain  Left-sided low back pain without sciatica      Subjective Assessment - 01/04/15 1323    Symptoms I am getting better, less pain and have been able to do more housework recently   Pertinent History has had some pain for about a year, she has responded will with PT but will flare up with her doing housework and yardwork   Limitations Sitting;Lifting;Standing;Walking;House hold activities   Patient Stated Goals no pain with house and yard work   Currently in Pain? Yes   Pain Score 2    Pain Location Back   Pain Orientation Left   Pain Descriptors / Indicators Aching;Sore   Pain Type Chronic pain   Pain Onset More than a month ago   Pain Frequency Intermittent   Aggravating Factors  housework   Pain Relieving Factors rest and this treatment                    [facility] Adult PT Treatment/Exercise - 01/04/15 0001    Ambulation/Gait   Gait Comments gait with patient out side 2 laps around building   Posture/Postural Control   Posture Comments scapular retractions, upper trap stretches   Lumbar  Exercises: Stretches   Passive Hamstring Stretch 20 seconds;2 reps   Single Knee to Chest Stretch 10 seconds;5 reps   Double Knee to Chest Stretch 10 seconds;5 reps   Lower Trunk Rotation 10 seconds;5 reps   Pelvic Tilt 10 seconds;5 reps   ITB Stretch 20 seconds;3 reps   Lumbar Exercises: Supine   Glut Set 5 reps;3 seconds   Clam 10 reps;3 seconds   Electrical Stimulation   Electrical Stimulation Location left SI; right shoulder   Electrical Stimulation Parameters premod   Electrical Stimulation Goals Pain   Manual Therapy   Manual Therapy Myofascial release   Myofascial Release right upper trap area  and into the rhomboid                     PT Long Term Goals - 01/02/15 1354    PT LONG TERM GOAL #1   Status Partially Met   PT LONG TERM GOAL #2   Status Partially Met   PT LONG TERM GOAL #3   Status Achieved               Plan - 01/04/15 1353    Clinical Impression Statement Doing better, increased tolerance to ADL's  with less pain   Pt will benefit from skilled therapeutic intervention in order to improve on the following deficits Decreased activity tolerance;Decreased range of motion;Increased muscle spasms;Impaired flexibility;Improper body mechanics;Pain   Rehab Potential Good   PT Frequency 2x / week   PT Duration 3 weeks   PT Treatment/Interventions Electrical Stimulation;Moist Heat;Ultrasound;Therapeutic exercise;Manual techniques;Patient/family education   PT Next Visit Plan continue with the supine stabilization   Consulted and Agree with Plan of Care Patient        Problem List Patient Active Problem List   Diagnosis Date Noted  . DVT (deep venous thrombosis) 04/13/2013  . Acute venous embolism and thrombosis of deep vessels of distal lower extremity 10/13/2012  . Varicose veins of lower extremities with other complications 85/27/7824    Sumner Boast, PT 01/04/2015, 1:55 PM  [place] [address] [place], [place], [address] Phone: [phone]   Fax:  [phone]

## 2015-01-10 ENCOUNTER — Encounter: Payer: Self-pay | Admitting: Physical Therapy

## 2015-01-10 ENCOUNTER — Ambulatory Visit: Payer: Medicare Other | Admitting: Physical Therapy

## 2015-01-10 DIAGNOSIS — M25511 Pain in right shoulder: Secondary | ICD-10-CM | POA: Diagnosis not present

## 2015-01-10 NOTE — Therapy (Signed)
[place] [place], [place], [address] Phone: [phone]   Fax:  [phone]  Physical Therapy Treatment  Patient Details  Name: Rachel Spencer MRN: [id number] Date of Birth: 02-22-1934 Referring Provider:  Altamese Harwood, MD  Encounter Date: 01/10/2015      PT End of Session - 01/10/15 1604    Visit Number 18   Date for PT Re-Evaluation 01/19/15   PT Start Time 1525   PT Stop Time 1630   PT Time Calculation (min) 65 min      Past Medical History  Diagnosis Date  . Varicose veins   . DVT (deep venous thrombosis)   . Hyperlipidemia   . Esophageal reflux     Past Surgical History  Procedure Laterality Date  . Appendectomy    . Skin grafting      on ulcers left ankle  . Vein ligation and stripping      There were no vitals filed for this visit.  Visit Diagnosis:  Right shoulder pain  Pain in joint, shoulder region, right      Subjective Assessment - 01/10/15 1535    Symptoms (p) Reports "a flare up Sunday while driving", unsure of a reason, but reports being busy that day and having to go to a funeral   Pertinent History (p) has had some pain for about a year, she has responded will with PT but will flare up with her doing housework and yardwork   Limitations (p) Sitting;Lifting;Standing;Walking;House hold activities   Patient Stated Goals (p) no pain with house and yard work   Currently in Pain? (p) Yes   Pain Score (p) 5    Pain Location (p) Shoulder   Pain Orientation (p) Right   Pain Descriptors / Indicators (p) Aching;Tightness   Pain Type (p) Chronic pain   Pain Onset (p) More than a month ago   Pain Frequency (p) Intermittent                       [facility] Adult PT Treatment/Exercise - 01/10/15 0001    Posture/Postural Control   Posture Comments scapular retractions, upper trap stretches   Moist Heat Therapy   Number Minutes Moist Heat 15 Minutes   Moist Heat  Location Shoulder   Electrical Stimulation   Electrical Stimulation Location left SI; right shoulder   Electrical Stimulation Parameters premod   Electrical Stimulation Goals Pain   Manual Therapy   Manual Therapy Myofascial release   Myofascial Release right upper trap area  and into the rhomboid, also used K-tape to unload the upper trap                PT Education - 01/10/15 1604    Education provided Yes   Education Details reviewed the HEP, needed cues and help as she had stopped doing and did not do correctly without cues   Person(s) Educated Patient   Methods Explanation;Demonstration;Tactile cues;Verbal cues   Comprehension Verbalized understanding;Returned demonstration;Verbal cues required;Tactile cues required             PT Long Term Goals - 01/02/15 1354    PT LONG TERM GOAL #1   Status Partially Met   PT LONG TERM GOAL #2   Status Partially Met   PT LONG TERM GOAL #3   Status Achieved               Plan - 01/10/15 1605  Clinical Impression Statement See if tape helps, she has a large and painful knot in the right upper trap,   Pt will benefit from skilled therapeutic intervention in order to improve on the following deficits Decreased activity tolerance;Decreased range of motion;Increased muscle spasms;Impaired flexibility;Improper body mechanics;Pain   Rehab Potential Good   PT Frequency 2x / week   PT Duration 2 weeks   PT Treatment/Interventions Electrical Stimulation;Moist Heat;Ultrasound;Therapeutic exercise;Manual techniques;Patient/family education   PT Next Visit Plan continue with the supine stabilization   Consulted and Agree with Plan of Care Patient        Problem List Patient Active Problem List   Diagnosis Date Noted  . DVT (deep venous thrombosis) 04/13/2013  . Acute venous embolism and thrombosis of deep vessels of distal lower extremity 10/13/2012  . Varicose veins of lower extremities with other complications  34/75/8307    Sumner Boast, PT 01/10/2015, 4:06 PM  [place] [address] [place], [place], [address] Phone: [phone]   Fax:  [phone]

## 2015-01-12 ENCOUNTER — Encounter: Payer: Self-pay | Admitting: Physical Therapy

## 2015-01-12 ENCOUNTER — Ambulatory Visit: Payer: Medicare Other | Admitting: Physical Therapy

## 2015-01-12 DIAGNOSIS — M25511 Pain in right shoulder: Secondary | ICD-10-CM

## 2015-01-12 NOTE — Therapy (Signed)
[facility] [address] [place], [place], [address] Phone: [phone]   Fax:  [phone]  Physical Therapy Treatment  Patient Details  Name: Rachel ChannelMartha D Yom MRN: [id number] Date of Birth: 11/12/1933 Referring Provider:  Myrene GalasHandy, Johnson [place], MD  Encounter Date: 01/12/2015      PT End of Session - 01/12/15 1556    Visit Number 19   Date for PT Re-Evaluation 01/19/15   PT Start Time 1502   PT Stop Time 1555   PT Time Calculation (min) 53 min      Past Medical History  Diagnosis Date  . Varicose veins   . DVT (deep venous thrombosis)   . Hyperlipidemia   . Esophageal reflux     Past Surgical History  Procedure Laterality Date  . Appendectomy    . Skin grafting      on ulcers left ankle  . Vein ligation and stripping      There were no vitals filed for this visit.  Visit Diagnosis:  Right shoulder pain  Pain in joint, shoulder region, right      Subjective Assessment - 01/12/15 1552    Symptoms Not much pain the last few days   Pertinent History has had some pain for about a year, she has responded will with PT but will flare up with her doing housework and yardwork   Limitations Sitting;Lifting;Standing;Walking;House hold activities   Patient Stated Goals no pain with house and yard work   Currently in Pain? Yes   Pain Score 1    Pain Location Back   Pain Orientation Left   Pain Descriptors / Indicators Aching   Pain Onset More than a month ago   Pain Frequency Intermittent                       [facility] Adult PT Treatment/Exercise - 01/12/15 0001    Ambulation/Gait   Gait Comments gait with patient out side 2 laps around building   Lumbar Exercises: Stretches   Passive Hamstring Stretch 20 seconds;2 reps   Pelvic Tilt 10 seconds;5 reps   ITB Stretch 20 seconds;3 reps   Lumbar Exercises: Supine   Clam 10 reps;3 seconds   Moist Heat Therapy   Number Minutes Moist Heat 15 Minutes    Moist Heat Location Shoulder   Electrical Stimulation   Electrical Stimulation Location left SI; right shoulder   Electrical Stimulation Goals Pain   Manual Therapy   Manual Therapy Myofascial release   Myofascial Release right upper trap area  and into the rhomboid, also used K-tape to unload the upper trap                PT Education - 01/12/15 1555    Education provided Yes   Education Details went over the pillow in car for arm prop as well as sleeping positions with pillow, reviewed all HEP and body mechanics   Person(s) Educated Patient   Methods Explanation   Comprehension Verbalized understanding             PT Long Term Goals - 01/12/15 1559    PT LONG TERM GOAL #1   Title be independent with HEP   Status Achieved   PT LONG TERM GOAL #2   Title report pain decreased 50%   Status Achieved               Plan - 01/12/15 1557    Clinical Impression Statement  Tape helped, minimal pain and doing really good the past few days   Rehab Potential Good   PT Treatment/Interventions Electrical Stimulation;Moist Heat;Ultrasound;Therapeutic exercise;Manual techniques;Patient/family education   PT Next Visit Plan Will hold treatment   Consulted and Agree with Plan of Care Patient          G-Codes - January 26, 2015 1600    Functional Assessment Tool Used FOTO   Functional Limitation Other PT primary   Other PT Primary Current Status ([id number]) At least 20 percent but less than 40 percent impaired, limited or restricted      Problem List Patient Active Problem List   Diagnosis Date Noted  . DVT (deep venous thrombosis) 04/13/2013  . Acute venous embolism and thrombosis of deep vessels of distal lower extremity 10/13/2012  . Varicose veins of lower extremities with other complications 01/27/2012    Jearld Lesch, PT 01-26-2015, 4:01 PM  [facility] [address] [place], [place],  [address] Phone: [phone]   Fax:  [phone]

## 2015-02-20 ENCOUNTER — Ambulatory Visit: Payer: Medicare Other | Attending: Orthopedic Surgery | Admitting: Physical Therapy

## 2015-02-20 ENCOUNTER — Encounter: Payer: Self-pay | Admitting: Physical Therapy

## 2015-02-20 DIAGNOSIS — M7072 Other bursitis of hip, left hip: Secondary | ICD-10-CM | POA: Insufficient documentation

## 2015-02-20 DIAGNOSIS — R5381 Other malaise: Secondary | ICD-10-CM

## 2015-02-20 DIAGNOSIS — M75101 Unspecified rotator cuff tear or rupture of right shoulder, not specified as traumatic: Secondary | ICD-10-CM | POA: Diagnosis not present

## 2015-02-20 DIAGNOSIS — M25511 Pain in right shoulder: Secondary | ICD-10-CM | POA: Diagnosis present

## 2015-02-20 DIAGNOSIS — M25552 Pain in left hip: Secondary | ICD-10-CM | POA: Insufficient documentation

## 2015-02-20 NOTE — Therapy (Signed)
[facility] [address] [place], [place], [address] Phone: [phone]   Fax:  [phone]  Physical Therapy Evaluation  Patient Details  Name: Rachel Spencer MRN: [id number] Date of Birth: 1934-02-24 Referring Provider:  Juluis Rainier, MD  Encounter Date: 02/20/2015      PT End of Session - 02/20/15 1104    Visit Number 1   Date for PT Re-Evaluation 04/22/15   PT Start Time 1059   PT Stop Time 1152   PT Time Calculation (min) 53 min      Past Medical History  Diagnosis Date  . Varicose veins   . DVT (deep venous thrombosis)   . Hyperlipidemia   . Esophageal reflux     Past Surgical History  Procedure Laterality Date  . Appendectomy    . Skin grafting      on ulcers left ankle  . Vein ligation and stripping      There were no vitals filed for this visit.  Visit Diagnosis:  Debility - Plan: PT plan of care cert/re-cert      Subjective Assessment - 02/20/15 1101    Subjective Patient comes in today with a new order for Osteoporosis program.  Reports recent bone scan showed increased bone loss.  She reports no falls but a few stumbels   Limitations Walking;House hold activities   Patient Stated Goals no falls   Currently in Pain? No/denies            [facility] PT Assessment - 02/20/15 0001    Assessment   Medical Diagnosis osteoporosis, fall risk   Onset Date 12/22/14   Prior Therapy for hip and shoulder recently, reports a new order for shoulder but would like to try on own   Precautions   Precautions None   Restrictions   Weight Bearing Restrictions No   Balance Screen   Has the patient fallen in the past 6 months No   Has the patient had a decrease in activity level because of a fear of falling?  No   Is the patient reluctant to leave their home because of a fear of falling?  No   Home Environment   Living Enviornment Private residence   Living Arrangements Alone   Prior Function   Level of Independence Independent with basic ADLs;Independent with homemaking with ambulation   Leisure none   ROM / Strength   AROM / PROM / Strength --  Lumbar ROM was decreased 25%, LE strength 4-/5   Flexibility   Soft Tissue Assessment /Muscle Length --  some tightness of the HS and ITB   Palpation   Palpation has a knot with pain in the right upper trap and into the rhomboid   Special Tests    Special Tests --  TUG was 16 seconds, some difficulty with tandem gait and SLS   Ambulation/Gait   Gait Comments she fatigues easily, slight slower pattern that normal                           PT Education - 02/20/15 1150    Education provided Yes   Education Details Gave handout for osteoporosis and balance activities   Person(s) Educated Patient   Methods Explanation;Demonstration;Handout   Comprehension Verbalized understanding          PT Short Term Goals - 02/20/15 1153    PT SHORT TERM GOAL #1   Title independent with initial  HEP   Time 1   Period Weeks   Status New           PT Long Term Goals - 02/20/15 1153    PT LONG TERM GOAL #1   Title understand osteoporosis program and how to help decrease the bone loss with activity and exercise   Time 4   Period Weeks   Status New               Plan - 02/20/15 1150    Clinical Impression Statement Patient reports recent bone scan showed osteoporosis that had inceased from last.  She does report a few stumbles, reports some difficulty with activity   Pt will benefit from skilled therapeutic intervention in order to improve on the following deficits Decreased activity tolerance;Decreased balance;Decreased endurance;Difficulty walking   Rehab Potential Good   PT Frequency 1x / week   PT Duration 4 weeks   PT Treatment/Interventions Neuromuscular re-education;Balance training;Therapeutic exercise;Gait training   PT Next Visit Plan Will see a few visits to assure her independence and safety  with HEP for balance and osteoporosis including walking program   Consulted and Agree with Plan of Care Patient          G-Codes - 02/20/15 1154    Functional Assessment Tool Used FOTO   Functional Limitation Other PT primary   Other PT Primary Current Status ([id number]) At least 40 percent but less than 60 percent impaired, limited or restricted   Other PT Primary Goal Status (U0454(G8991) At least 20 percent but less than 40 percent impaired, limited or restricted       Problem List Patient Active Problem List   Diagnosis Date Noted  . DVT (deep venous thrombosis) 04/13/2013  . Acute venous embolism and thrombosis of deep vessels of distal lower extremity 10/13/2012  . Varicose veins of lower extremities with other complications 01/27/2012    Jearld LeschALBRIGHT,Tiajah Oyster W, PT 02/20/2015, 11:58 AM  [facility] [address] [place], [place], [address] Phone: [phone]   Fax:  [phone]

## 2015-03-01 ENCOUNTER — Encounter: Payer: Self-pay | Admitting: Physical Therapy

## 2015-03-01 ENCOUNTER — Ambulatory Visit: Payer: Medicare Other | Attending: Orthopedic Surgery | Admitting: Physical Therapy

## 2015-03-01 DIAGNOSIS — R5381 Other malaise: Secondary | ICD-10-CM

## 2015-03-01 DIAGNOSIS — M75101 Unspecified rotator cuff tear or rupture of right shoulder, not specified as traumatic: Secondary | ICD-10-CM | POA: Insufficient documentation

## 2015-03-01 DIAGNOSIS — M25552 Pain in left hip: Secondary | ICD-10-CM | POA: Insufficient documentation

## 2015-03-01 DIAGNOSIS — M25511 Pain in right shoulder: Secondary | ICD-10-CM | POA: Insufficient documentation

## 2015-03-01 DIAGNOSIS — M7072 Other bursitis of hip, left hip: Secondary | ICD-10-CM | POA: Diagnosis not present

## 2015-03-01 NOTE — Therapy (Addendum)
[address] [place], [place], [address] Phone: [phone]   Fax:  [phone]  Physical Therapy Treatment  Patient Details  Name: Rachel Spencer MRN: [id number] Date of Birth: [birth year] Referring Provider:  Altamese Aguilita, MD  Encounter Date: 03/01/2015    Past Medical History  Diagnosis Date  . Varicose veins   . DVT (deep venous thrombosis)   . Hyperlipidemia   . Esophageal reflux     Past Surgical History  Procedure Laterality Date  . Appendectomy    . Skin grafting      on ulcers left ankle  . Vein ligation and stripping      There were no vitals filed for this visit.  Visit Diagnosis:  Debility                                 PT Short Term Goals - 03/01/15 1138    PT SHORT TERM GOAL #1   Title independent with initial HEP   Status Achieved           PT Long Term Goals - 03/01/15 1138    PT LONG TERM GOAL #1   Title understand osteoporosis program and how to help decrease the bone loss with activity and exercise   Status Achieved              PHYSICAL THERAPY DISCHARGE SUMMARY  Visits from Start of Care: 7  Current functional level related to goals / functional outcomes: Goals met    Plan: Patient agrees to discharge.  Patient goals were met. Patient is being discharged due to meeting the stated rehab goals.  ?????       Problem List Patient Active Problem List   Diagnosis Date Noted  . DVT (deep venous thrombosis) 04/13/2013  . Acute venous embolism and thrombosis of deep vessels of distal lower extremity 10/13/2012  . Varicose veins of lower extremities with other complications 69/62/9528    Sumner Boast, PT 06/07/2015, 8:16 AM  [place] [address] [place], [place], [address] Phone: [phone]   Fax:  [phone]

## 2015-08-21 ENCOUNTER — Encounter (HOSPITAL_COMMUNITY): Payer: Self-pay

## 2015-08-21 ENCOUNTER — Ambulatory Visit (HOSPITAL_COMMUNITY)
Admission: RE | Admit: 2015-08-21 | Discharge: 2015-08-21 | Disposition: A | Discharge status: Home / Self Care | Payer: Medicare Other | Source: Ambulatory Visit | Attending: Internal Medicine | Admitting: Internal Medicine

## 2015-08-21 DIAGNOSIS — M810 Age-related osteoporosis without current pathological fracture: Secondary | ICD-10-CM | POA: Diagnosis present

## 2015-08-21 DIAGNOSIS — K219 Gastro-esophageal reflux disease without esophagitis: Secondary | ICD-10-CM | POA: Insufficient documentation

## 2015-08-21 MED ORDER — SODIUM CHLORIDE 0.9 % IV SOLN
250.0000 mL | INTRAVENOUS | Status: AC
  Administered 2015-08-21: 250 mL via INTRAVENOUS

## 2015-08-21 MED ORDER — ZOLEDRONIC ACID 5 MG/100ML IV SOLN
5.0000 mg | Freq: Once | INTRAVENOUS | Status: AC
  Administered 2015-08-21: 5 mg via INTRAVENOUS
  Filled 2015-08-21: qty 100

## 2015-08-21 NOTE — Discharge Instructions (Signed)

## 2015-12-12 DIAGNOSIS — N3941 Urge incontinence: Secondary | ICD-10-CM | POA: Diagnosis not present

## 2015-12-12 DIAGNOSIS — R350 Frequency of micturition: Secondary | ICD-10-CM | POA: Diagnosis not present

## 2015-12-25 DIAGNOSIS — H04123 Dry eye syndrome of bilateral lacrimal glands: Secondary | ICD-10-CM | POA: Diagnosis not present

## 2015-12-25 DIAGNOSIS — H2513 Age-related nuclear cataract, bilateral: Secondary | ICD-10-CM | POA: Diagnosis not present

## 2016-01-01 DIAGNOSIS — Z1211 Encounter for screening for malignant neoplasm of colon: Secondary | ICD-10-CM | POA: Diagnosis not present

## 2016-01-02 DIAGNOSIS — N3941 Urge incontinence: Secondary | ICD-10-CM | POA: Diagnosis not present

## 2016-01-02 DIAGNOSIS — R350 Frequency of micturition: Secondary | ICD-10-CM | POA: Diagnosis not present

## 2016-01-09 DIAGNOSIS — J383 Other diseases of vocal cords: Secondary | ICD-10-CM | POA: Diagnosis not present

## 2016-01-09 DIAGNOSIS — H9113 Presbycusis, bilateral: Secondary | ICD-10-CM | POA: Diagnosis not present

## 2016-01-09 DIAGNOSIS — H903 Sensorineural hearing loss, bilateral: Secondary | ICD-10-CM | POA: Diagnosis not present

## 2016-01-10 DIAGNOSIS — H16223 Keratoconjunctivitis sicca, not specified as Sjogren's, bilateral: Secondary | ICD-10-CM | POA: Diagnosis not present

## 2016-01-23 DIAGNOSIS — R350 Frequency of micturition: Secondary | ICD-10-CM | POA: Diagnosis not present

## 2016-01-23 DIAGNOSIS — N3941 Urge incontinence: Secondary | ICD-10-CM | POA: Diagnosis not present

## 2016-01-30 DIAGNOSIS — G43019 Migraine without aura, intractable, without status migrainosus: Secondary | ICD-10-CM | POA: Diagnosis not present

## 2016-01-30 DIAGNOSIS — G43719 Chronic migraine without aura, intractable, without status migrainosus: Secondary | ICD-10-CM | POA: Diagnosis not present

## 2016-02-13 DIAGNOSIS — R350 Frequency of micturition: Secondary | ICD-10-CM | POA: Diagnosis not present

## 2016-02-22 DIAGNOSIS — K219 Gastro-esophageal reflux disease without esophagitis: Secondary | ICD-10-CM | POA: Diagnosis not present

## 2016-02-22 DIAGNOSIS — M810 Age-related osteoporosis without current pathological fracture: Secondary | ICD-10-CM | POA: Diagnosis not present

## 2016-02-22 DIAGNOSIS — E559 Vitamin D deficiency, unspecified: Secondary | ICD-10-CM | POA: Diagnosis not present

## 2016-03-05 DIAGNOSIS — R350 Frequency of micturition: Secondary | ICD-10-CM | POA: Diagnosis not present

## 2016-03-18 DIAGNOSIS — M2012 Hallux valgus (acquired), left foot: Secondary | ICD-10-CM | POA: Diagnosis not present

## 2016-03-18 DIAGNOSIS — M2042 Other hammer toe(s) (acquired), left foot: Secondary | ICD-10-CM | POA: Diagnosis not present

## 2016-03-20 DIAGNOSIS — M2042 Other hammer toe(s) (acquired), left foot: Secondary | ICD-10-CM | POA: Diagnosis not present

## 2016-03-20 DIAGNOSIS — M2012 Hallux valgus (acquired), left foot: Secondary | ICD-10-CM | POA: Diagnosis not present

## 2016-03-26 DIAGNOSIS — R350 Frequency of micturition: Secondary | ICD-10-CM | POA: Diagnosis not present

## 2016-03-27 DIAGNOSIS — E7800 Pure hypercholesterolemia, unspecified: Secondary | ICD-10-CM | POA: Diagnosis not present

## 2016-03-27 DIAGNOSIS — Z1389 Encounter for screening for other disorder: Secondary | ICD-10-CM | POA: Diagnosis not present

## 2016-03-27 DIAGNOSIS — K219 Gastro-esophageal reflux disease without esophagitis: Secondary | ICD-10-CM | POA: Diagnosis not present

## 2016-03-27 DIAGNOSIS — Z0000 Encounter for general adult medical examination without abnormal findings: Secondary | ICD-10-CM | POA: Diagnosis not present

## 2016-03-27 DIAGNOSIS — M810 Age-related osteoporosis without current pathological fracture: Secondary | ICD-10-CM | POA: Diagnosis not present

## 2016-03-27 DIAGNOSIS — E559 Vitamin D deficiency, unspecified: Secondary | ICD-10-CM | POA: Diagnosis not present

## 2016-03-27 DIAGNOSIS — G43909 Migraine, unspecified, not intractable, without status migrainosus: Secondary | ICD-10-CM | POA: Diagnosis not present

## 2016-03-27 DIAGNOSIS — Z1211 Encounter for screening for malignant neoplasm of colon: Secondary | ICD-10-CM | POA: Diagnosis not present

## 2016-04-04 DIAGNOSIS — M21612 Bunion of left foot: Secondary | ICD-10-CM | POA: Diagnosis not present

## 2016-04-04 DIAGNOSIS — Z01818 Encounter for other preprocedural examination: Secondary | ICD-10-CM | POA: Diagnosis not present

## 2016-04-04 DIAGNOSIS — G43909 Migraine, unspecified, not intractable, without status migrainosus: Secondary | ICD-10-CM | POA: Diagnosis not present

## 2016-04-04 DIAGNOSIS — M2042 Other hammer toe(s) (acquired), left foot: Secondary | ICD-10-CM | POA: Diagnosis not present

## 2016-04-04 DIAGNOSIS — M810 Age-related osteoporosis without current pathological fracture: Secondary | ICD-10-CM | POA: Diagnosis not present

## 2016-04-16 DIAGNOSIS — N3941 Urge incontinence: Secondary | ICD-10-CM | POA: Diagnosis not present

## 2016-04-16 DIAGNOSIS — R350 Frequency of micturition: Secondary | ICD-10-CM | POA: Diagnosis not present

## 2016-04-17 DIAGNOSIS — M2042 Other hammer toe(s) (acquired), left foot: Secondary | ICD-10-CM | POA: Diagnosis not present

## 2016-04-17 DIAGNOSIS — M2012 Hallux valgus (acquired), left foot: Secondary | ICD-10-CM | POA: Diagnosis not present

## 2016-04-24 DIAGNOSIS — M1990 Unspecified osteoarthritis, unspecified site: Secondary | ICD-10-CM | POA: Diagnosis not present

## 2016-04-24 DIAGNOSIS — Z79899 Other long term (current) drug therapy: Secondary | ICD-10-CM | POA: Diagnosis not present

## 2016-04-24 DIAGNOSIS — Z86718 Personal history of other venous thrombosis and embolism: Secondary | ICD-10-CM | POA: Diagnosis not present

## 2016-04-24 DIAGNOSIS — E785 Hyperlipidemia, unspecified: Secondary | ICD-10-CM | POA: Diagnosis not present

## 2016-04-24 DIAGNOSIS — K219 Gastro-esophageal reflux disease without esophagitis: Secondary | ICD-10-CM | POA: Diagnosis not present

## 2016-04-24 DIAGNOSIS — M2042 Other hammer toe(s) (acquired), left foot: Secondary | ICD-10-CM | POA: Diagnosis not present

## 2016-04-24 DIAGNOSIS — Z9851 Tubal ligation status: Secondary | ICD-10-CM | POA: Diagnosis not present

## 2016-04-24 DIAGNOSIS — G43909 Migraine, unspecified, not intractable, without status migrainosus: Secondary | ICD-10-CM | POA: Diagnosis not present

## 2016-04-24 DIAGNOSIS — M2012 Hallux valgus (acquired), left foot: Secondary | ICD-10-CM | POA: Diagnosis not present

## 2016-04-24 DIAGNOSIS — M810 Age-related osteoporosis without current pathological fracture: Secondary | ICD-10-CM | POA: Diagnosis not present

## 2016-05-13 DIAGNOSIS — M2042 Other hammer toe(s) (acquired), left foot: Secondary | ICD-10-CM | POA: Diagnosis not present

## 2016-05-13 DIAGNOSIS — M2012 Hallux valgus (acquired), left foot: Secondary | ICD-10-CM | POA: Diagnosis not present

## 2016-05-14 DIAGNOSIS — R921 Mammographic calcification found on diagnostic imaging of breast: Secondary | ICD-10-CM | POA: Diagnosis not present

## 2016-05-14 DIAGNOSIS — Z09 Encounter for follow-up examination after completed treatment for conditions other than malignant neoplasm: Secondary | ICD-10-CM | POA: Diagnosis not present

## 2016-05-14 DIAGNOSIS — N6002 Solitary cyst of left breast: Secondary | ICD-10-CM | POA: Diagnosis not present

## 2016-05-21 DIAGNOSIS — R350 Frequency of micturition: Secondary | ICD-10-CM | POA: Diagnosis not present

## 2016-05-21 DIAGNOSIS — N3941 Urge incontinence: Secondary | ICD-10-CM | POA: Diagnosis not present

## 2016-06-18 DIAGNOSIS — N3941 Urge incontinence: Secondary | ICD-10-CM | POA: Diagnosis not present

## 2016-07-18 DIAGNOSIS — R350 Frequency of micturition: Secondary | ICD-10-CM | POA: Diagnosis not present

## 2016-07-18 DIAGNOSIS — N3941 Urge incontinence: Secondary | ICD-10-CM | POA: Diagnosis not present

## 2016-07-30 DIAGNOSIS — G43719 Chronic migraine without aura, intractable, without status migrainosus: Secondary | ICD-10-CM | POA: Diagnosis not present

## 2016-07-30 DIAGNOSIS — G43019 Migraine without aura, intractable, without status migrainosus: Secondary | ICD-10-CM | POA: Diagnosis not present

## 2016-08-13 DIAGNOSIS — M8588 Other specified disorders of bone density and structure, other site: Secondary | ICD-10-CM | POA: Diagnosis not present

## 2016-08-13 DIAGNOSIS — M810 Age-related osteoporosis without current pathological fracture: Secondary | ICD-10-CM | POA: Diagnosis not present

## 2016-08-15 DIAGNOSIS — N3941 Urge incontinence: Secondary | ICD-10-CM | POA: Diagnosis not present

## 2016-08-15 DIAGNOSIS — R350 Frequency of micturition: Secondary | ICD-10-CM | POA: Diagnosis not present

## 2016-08-27 DIAGNOSIS — M810 Age-related osteoporosis without current pathological fracture: Secondary | ICD-10-CM | POA: Diagnosis not present

## 2016-08-27 DIAGNOSIS — Z8639 Personal history of other endocrine, nutritional and metabolic disease: Secondary | ICD-10-CM | POA: Diagnosis not present

## 2016-08-27 DIAGNOSIS — Z23 Encounter for immunization: Secondary | ICD-10-CM | POA: Diagnosis not present

## 2016-08-27 DIAGNOSIS — K219 Gastro-esophageal reflux disease without esophagitis: Secondary | ICD-10-CM | POA: Diagnosis not present

## 2016-09-10 DIAGNOSIS — N3941 Urge incontinence: Secondary | ICD-10-CM | POA: Diagnosis not present

## 2016-09-10 DIAGNOSIS — R350 Frequency of micturition: Secondary | ICD-10-CM | POA: Diagnosis not present

## 2016-09-17 ENCOUNTER — Encounter (HOSPITAL_COMMUNITY): Payer: Self-pay

## 2016-09-17 ENCOUNTER — Ambulatory Visit (HOSPITAL_COMMUNITY)
Admission: RE | Admit: 2016-09-17 | Discharge: 2016-09-17 | Disposition: A | Discharge status: Home / Self Care | Payer: Medicare Other | Source: Ambulatory Visit | Attending: Internal Medicine | Admitting: Internal Medicine

## 2016-09-17 DIAGNOSIS — M810 Age-related osteoporosis without current pathological fracture: Secondary | ICD-10-CM | POA: Insufficient documentation

## 2016-09-17 HISTORY — DX: Unspecified osteoarthritis, unspecified site: M19.90

## 2016-09-17 MED ORDER — ZOLEDRONIC ACID 5 MG/100ML IV SOLN
5.0000 mg | Freq: Once | INTRAVENOUS | Status: AC
  Administered 2016-09-17: 5 mg via INTRAVENOUS
  Filled 2016-09-17: qty 100

## 2016-09-17 MED ORDER — SODIUM CHLORIDE 0.9 % IV SOLN
Freq: Once | INTRAVENOUS | Status: AC
  Administered 2016-09-17: 14:00:00 via INTRAVENOUS

## 2016-09-17 NOTE — Discharge Instructions (Signed)
Zoledronic Acid injection (Paget's Disease, Osteoporosis) °What is this medicine? °ZOLEDRONIC ACID (ZOE le dron ik AS id) lowers the amount of calcium loss from bone. It is used to treat Paget's disease and osteoporosis in women. °COMMON BRAND NAME(S): Reclast, Zometa °What should I tell my health care provider before I take this medicine? °They need to know if you have any of these conditions: °-aspirin-sensitive asthma °-cancer, especially if you are receiving medicines used to treat cancer °-dental disease or wear dentures °-infection °-kidney disease °-low levels of calcium in the blood °-past surgery on the parathyroid gland or intestines °-receiving corticosteroids like dexamethasone or prednisone °-an unusual or allergic reaction to zoledronic acid, other medicines, foods, dyes, or preservatives °-pregnant or trying to get pregnant °-breast-feeding °How should I use this medicine? °This medicine is for infusion into a vein. It is given by a health care professional in a hospital or clinic setting. °Talk to your pediatrician regarding the use of this medicine in children. This medicine is not approved for use in children. °What if I miss a dose? °It is important not to miss your dose. Call your doctor or health care professional if you are unable to keep an appointment. °What may interact with this medicine? °-certain antibiotics given by injection °-NSAIDs, medicines for pain and inflammation, like ibuprofen or naproxen °-some diuretics like bumetanide, furosemide °-teriparatide °What should I watch for while using this medicine? °Visit your doctor or health care professional for regular checkups. It may be some time before you see the benefit from this medicine. Do not stop taking your medicine unless your doctor tells you to. Your doctor may order blood tests or other tests to see how you are doing. °Women should inform their doctor if they wish to become pregnant or think they might be pregnant. There is a  potential for serious side effects to an unborn child. Talk to your health care professional or pharmacist for more information. °You should make sure that you get enough calcium and vitamin D while you are taking this medicine. Discuss the foods you eat and the vitamins you take with your health care professional. °Some people who take this medicine have severe bone, joint, and/or muscle pain. This medicine may also increase your risk for jaw problems or a broken thigh bone. Tell your doctor right away if you have severe pain in your jaw, bones, joints, or muscles. Tell your doctor if you have any pain that does not go away or that gets worse. °Tell your dentist and dental surgeon that you are taking this medicine. You should not have major dental surgery while on this medicine. See your dentist to have a dental exam and fix any dental problems before starting this medicine. Take good care of your teeth while on this medicine. Make sure you see your dentist for regular follow-up appointments. °What side effects may I notice from receiving this medicine? °Side effects that you should report to your doctor or health care professional as soon as possible: °-allergic reactions like skin rash, itching or hives, swelling of the face, lips, or tongue °-anxiety, confusion, or depression °-breathing problems °-changes in vision °-eye pain °-feeling faint or lightheaded, falls °-jaw pain, especially after dental work °-mouth sores °-muscle cramps, stiffness, or weakness °-redness, blistering, peeling or loosening of the skin, including inside the mouth °-trouble passing urine or change in the amount of urine °Side effects that usually do not require medical attention (report to your doctor or health care professional if   they continue or are bothersome): °-bone, joint, or muscle pain °-constipation °-diarrhea °-fever °-hair loss °-irritation at site where injected °-loss of appetite °-nausea, vomiting °-stomach  upset °-trouble sleeping °-trouble swallowing °-weak or tired °Where should I keep my medicine? °This drug is given in a hospital or clinic and will not be stored at home. °© 2017 [employer]vier/Gold Standard (2014-03-12 14:19:57) ° °

## 2016-10-01 DIAGNOSIS — R350 Frequency of micturition: Secondary | ICD-10-CM | POA: Diagnosis not present

## 2016-10-25 DIAGNOSIS — R350 Frequency of micturition: Secondary | ICD-10-CM | POA: Diagnosis not present

## 2016-10-25 DIAGNOSIS — N3941 Urge incontinence: Secondary | ICD-10-CM | POA: Diagnosis not present

## 2016-11-12 DIAGNOSIS — N6002 Solitary cyst of left breast: Secondary | ICD-10-CM | POA: Diagnosis not present

## 2016-11-19 DIAGNOSIS — R350 Frequency of micturition: Secondary | ICD-10-CM | POA: Diagnosis not present

## 2016-12-10 DIAGNOSIS — N3941 Urge incontinence: Secondary | ICD-10-CM | POA: Diagnosis not present

## 2016-12-10 DIAGNOSIS — R350 Frequency of micturition: Secondary | ICD-10-CM | POA: Diagnosis not present

## 2016-12-31 DIAGNOSIS — R350 Frequency of micturition: Secondary | ICD-10-CM | POA: Diagnosis not present

## 2016-12-31 DIAGNOSIS — N3941 Urge incontinence: Secondary | ICD-10-CM | POA: Diagnosis not present

## 2017-01-28 DIAGNOSIS — R350 Frequency of micturition: Secondary | ICD-10-CM | POA: Diagnosis not present

## 2017-01-28 DIAGNOSIS — N3941 Urge incontinence: Secondary | ICD-10-CM | POA: Diagnosis not present

## 2017-02-18 DIAGNOSIS — N3941 Urge incontinence: Secondary | ICD-10-CM | POA: Diagnosis not present

## 2017-03-11 DIAGNOSIS — N3941 Urge incontinence: Secondary | ICD-10-CM | POA: Diagnosis not present

## 2017-03-11 DIAGNOSIS — R350 Frequency of micturition: Secondary | ICD-10-CM | POA: Diagnosis not present

## 2017-04-01 DIAGNOSIS — R350 Frequency of micturition: Secondary | ICD-10-CM | POA: Diagnosis not present

## 2017-04-01 DIAGNOSIS — N3941 Urge incontinence: Secondary | ICD-10-CM | POA: Diagnosis not present

## 2017-04-29 DIAGNOSIS — R350 Frequency of micturition: Secondary | ICD-10-CM | POA: Diagnosis not present

## 2017-04-29 DIAGNOSIS — N3941 Urge incontinence: Secondary | ICD-10-CM | POA: Diagnosis not present

## 2017-05-20 DIAGNOSIS — R350 Frequency of micturition: Secondary | ICD-10-CM | POA: Diagnosis not present

## 2017-05-20 DIAGNOSIS — N3941 Urge incontinence: Secondary | ICD-10-CM | POA: Diagnosis not present

## 2017-05-22 DIAGNOSIS — G43019 Migraine without aura, intractable, without status migrainosus: Secondary | ICD-10-CM | POA: Diagnosis not present

## 2017-05-22 DIAGNOSIS — G43719 Chronic migraine without aura, intractable, without status migrainosus: Secondary | ICD-10-CM | POA: Diagnosis not present

## 2017-05-28 DIAGNOSIS — T8484XA Pain due to internal orthopedic prosthetic devices, implants and grafts, initial encounter: Secondary | ICD-10-CM | POA: Diagnosis not present

## 2017-05-28 DIAGNOSIS — M5416 Radiculopathy, lumbar region: Secondary | ICD-10-CM | POA: Diagnosis not present

## 2017-05-28 DIAGNOSIS — M7061 Trochanteric bursitis, right hip: Secondary | ICD-10-CM | POA: Diagnosis not present

## 2017-06-10 DIAGNOSIS — N3941 Urge incontinence: Secondary | ICD-10-CM | POA: Diagnosis not present

## 2017-07-01 DIAGNOSIS — R350 Frequency of micturition: Secondary | ICD-10-CM | POA: Diagnosis not present

## 2017-07-01 DIAGNOSIS — N3941 Urge incontinence: Secondary | ICD-10-CM | POA: Diagnosis not present

## 2017-07-29 DIAGNOSIS — R350 Frequency of micturition: Secondary | ICD-10-CM | POA: Diagnosis not present

## 2017-07-29 DIAGNOSIS — N3941 Urge incontinence: Secondary | ICD-10-CM | POA: Diagnosis not present

## 2017-08-19 DIAGNOSIS — R350 Frequency of micturition: Secondary | ICD-10-CM | POA: Diagnosis not present

## 2017-08-25 ENCOUNTER — Other Ambulatory Visit: Payer: Self-pay | Admitting: Orthopedic Surgery

## 2017-08-25 DIAGNOSIS — M5416 Radiculopathy, lumbar region: Secondary | ICD-10-CM

## 2017-09-02 DIAGNOSIS — M810 Age-related osteoporosis without current pathological fracture: Secondary | ICD-10-CM | POA: Diagnosis not present

## 2017-09-02 DIAGNOSIS — Z23 Encounter for immunization: Secondary | ICD-10-CM | POA: Diagnosis not present

## 2017-09-02 DIAGNOSIS — E559 Vitamin D deficiency, unspecified: Secondary | ICD-10-CM | POA: Diagnosis not present

## 2017-09-02 DIAGNOSIS — Z0000 Encounter for general adult medical examination without abnormal findings: Secondary | ICD-10-CM | POA: Diagnosis not present

## 2017-09-03 ENCOUNTER — Ambulatory Visit
Admission: RE | Admit: 2017-09-03 | Discharge: 2017-09-03 | Disposition: A | Discharge status: Home / Self Care | Payer: Medicare Other | Source: Ambulatory Visit | Attending: Orthopedic Surgery | Admitting: Orthopedic Surgery

## 2017-09-03 DIAGNOSIS — M48061 Spinal stenosis, lumbar region without neurogenic claudication: Secondary | ICD-10-CM | POA: Diagnosis not present

## 2017-09-03 DIAGNOSIS — M5416 Radiculopathy, lumbar region: Secondary | ICD-10-CM

## 2017-09-05 DIAGNOSIS — Z0000 Encounter for general adult medical examination without abnormal findings: Secondary | ICD-10-CM | POA: Diagnosis not present

## 2017-09-05 DIAGNOSIS — Z1389 Encounter for screening for other disorder: Secondary | ICD-10-CM | POA: Diagnosis not present

## 2017-09-05 DIAGNOSIS — K219 Gastro-esophageal reflux disease without esophagitis: Secondary | ICD-10-CM | POA: Diagnosis not present

## 2017-09-05 DIAGNOSIS — M810 Age-related osteoporosis without current pathological fracture: Secondary | ICD-10-CM | POA: Diagnosis not present

## 2017-09-05 DIAGNOSIS — Z8639 Personal history of other endocrine, nutritional and metabolic disease: Secondary | ICD-10-CM | POA: Diagnosis not present

## 2017-09-05 DIAGNOSIS — E559 Vitamin D deficiency, unspecified: Secondary | ICD-10-CM | POA: Diagnosis not present

## 2017-09-09 DIAGNOSIS — N3941 Urge incontinence: Secondary | ICD-10-CM | POA: Diagnosis not present

## 2017-09-09 DIAGNOSIS — R350 Frequency of micturition: Secondary | ICD-10-CM | POA: Diagnosis not present

## 2017-09-30 DIAGNOSIS — E559 Vitamin D deficiency, unspecified: Secondary | ICD-10-CM | POA: Diagnosis not present

## 2017-09-30 DIAGNOSIS — K219 Gastro-esophageal reflux disease without esophagitis: Secondary | ICD-10-CM | POA: Diagnosis not present

## 2017-09-30 DIAGNOSIS — N3941 Urge incontinence: Secondary | ICD-10-CM | POA: Diagnosis not present

## 2017-10-03 ENCOUNTER — Ambulatory Visit (HOSPITAL_COMMUNITY)
Admission: RE | Admit: 2017-10-03 | Discharge: 2017-10-03 | Disposition: A | Discharge status: Home / Self Care | Payer: Medicare Other | Source: Ambulatory Visit | Attending: Internal Medicine | Admitting: Internal Medicine

## 2017-10-03 ENCOUNTER — Encounter (HOSPITAL_COMMUNITY): Payer: Self-pay

## 2017-10-03 DIAGNOSIS — M810 Age-related osteoporosis without current pathological fracture: Secondary | ICD-10-CM | POA: Diagnosis not present

## 2017-10-03 MED ORDER — SODIUM CHLORIDE 0.9 % IV SOLN
Freq: Once | INTRAVENOUS | Status: AC
  Administered 2017-10-03: 13:00:00 via INTRAVENOUS

## 2017-10-03 MED ORDER — ZOLEDRONIC ACID 5 MG/100ML IV SOLN
5.0000 mg | Freq: Once | INTRAVENOUS | Status: AC
  Administered 2017-10-03: 5 mg via INTRAVENOUS
  Filled 2017-10-03: qty 100

## 2017-10-03 NOTE — Discharge Instructions (Signed)

## 2017-10-03 NOTE — Progress Notes (Signed)
Uneventful Reclast infusion.  Pt given d/c instructions on reclast.  Pt has had in 2016 and 2017.  Pt was d/c ambulatory to lobby.

## 2017-10-23 DIAGNOSIS — N3941 Urge incontinence: Secondary | ICD-10-CM | POA: Diagnosis not present

## 2017-10-23 DIAGNOSIS — R350 Frequency of micturition: Secondary | ICD-10-CM | POA: Diagnosis not present

## 2017-11-05 ENCOUNTER — Other Ambulatory Visit: Payer: Self-pay | Admitting: Orthopedic Surgery

## 2017-11-05 DIAGNOSIS — M129 Arthropathy, unspecified: Secondary | ICD-10-CM

## 2017-11-11 DIAGNOSIS — R350 Frequency of micturition: Secondary | ICD-10-CM | POA: Diagnosis not present

## 2017-11-12 ENCOUNTER — Ambulatory Visit
Admission: RE | Admit: 2017-11-12 | Discharge: 2017-11-12 | Disposition: A | Discharge status: Home / Self Care | Payer: Medicare Other | Source: Ambulatory Visit | Attending: Orthopedic Surgery | Admitting: Orthopedic Surgery

## 2017-11-12 DIAGNOSIS — M545 Low back pain: Secondary | ICD-10-CM | POA: Diagnosis not present

## 2017-11-12 DIAGNOSIS — M129 Arthropathy, unspecified: Secondary | ICD-10-CM

## 2017-11-12 MED ORDER — IOPAMIDOL (ISOVUE-M 200) INJECTION 41%
1.0000 mL | Freq: Once | INTRAMUSCULAR | Status: AC
  Administered 2017-11-12: 1 mL via EPIDURAL

## 2017-11-12 MED ORDER — METHYLPREDNISOLONE ACETATE 40 MG/ML INJ SUSP (RADIOLOG
120.0000 mg | Freq: Once | INTRAMUSCULAR | Status: AC
  Administered 2017-11-12: 120 mg via EPIDURAL

## 2017-11-12 NOTE — Discharge Instructions (Signed)

## 2017-11-26 DIAGNOSIS — M5116 Intervertebral disc disorders with radiculopathy, lumbar region: Secondary | ICD-10-CM | POA: Diagnosis not present

## 2017-12-02 DIAGNOSIS — R350 Frequency of micturition: Secondary | ICD-10-CM | POA: Diagnosis not present

## 2017-12-02 DIAGNOSIS — N3941 Urge incontinence: Secondary | ICD-10-CM | POA: Diagnosis not present

## 2017-12-03 DIAGNOSIS — M25552 Pain in left hip: Secondary | ICD-10-CM | POA: Diagnosis not present

## 2017-12-03 DIAGNOSIS — M545 Low back pain: Secondary | ICD-10-CM | POA: Diagnosis not present

## 2017-12-03 DIAGNOSIS — M6281 Muscle weakness (generalized): Secondary | ICD-10-CM | POA: Diagnosis not present

## 2017-12-03 DIAGNOSIS — M62559 Muscle wasting and atrophy, not elsewhere classified, unspecified thigh: Secondary | ICD-10-CM | POA: Diagnosis not present

## 2017-12-08 DIAGNOSIS — M25552 Pain in left hip: Secondary | ICD-10-CM | POA: Diagnosis not present

## 2017-12-08 DIAGNOSIS — M545 Low back pain: Secondary | ICD-10-CM | POA: Diagnosis not present

## 2017-12-08 DIAGNOSIS — M6281 Muscle weakness (generalized): Secondary | ICD-10-CM | POA: Diagnosis not present

## 2017-12-08 DIAGNOSIS — M62559 Muscle wasting and atrophy, not elsewhere classified, unspecified thigh: Secondary | ICD-10-CM | POA: Diagnosis not present

## 2017-12-12 DIAGNOSIS — M25552 Pain in left hip: Secondary | ICD-10-CM | POA: Diagnosis not present

## 2017-12-12 DIAGNOSIS — M6281 Muscle weakness (generalized): Secondary | ICD-10-CM | POA: Diagnosis not present

## 2017-12-12 DIAGNOSIS — M62559 Muscle wasting and atrophy, not elsewhere classified, unspecified thigh: Secondary | ICD-10-CM | POA: Diagnosis not present

## 2017-12-12 DIAGNOSIS — M545 Low back pain: Secondary | ICD-10-CM | POA: Diagnosis not present

## 2017-12-19 DIAGNOSIS — M545 Low back pain: Secondary | ICD-10-CM | POA: Diagnosis not present

## 2017-12-19 DIAGNOSIS — M6281 Muscle weakness (generalized): Secondary | ICD-10-CM | POA: Diagnosis not present

## 2017-12-19 DIAGNOSIS — M25552 Pain in left hip: Secondary | ICD-10-CM | POA: Diagnosis not present

## 2017-12-19 DIAGNOSIS — M62559 Muscle wasting and atrophy, not elsewhere classified, unspecified thigh: Secondary | ICD-10-CM | POA: Diagnosis not present

## 2017-12-22 DIAGNOSIS — M62559 Muscle wasting and atrophy, not elsewhere classified, unspecified thigh: Secondary | ICD-10-CM | POA: Diagnosis not present

## 2017-12-22 DIAGNOSIS — M25552 Pain in left hip: Secondary | ICD-10-CM | POA: Diagnosis not present

## 2017-12-22 DIAGNOSIS — M6281 Muscle weakness (generalized): Secondary | ICD-10-CM | POA: Diagnosis not present

## 2017-12-22 DIAGNOSIS — M545 Low back pain: Secondary | ICD-10-CM | POA: Diagnosis not present

## 2017-12-23 DIAGNOSIS — N3941 Urge incontinence: Secondary | ICD-10-CM | POA: Diagnosis not present

## 2017-12-23 DIAGNOSIS — R350 Frequency of micturition: Secondary | ICD-10-CM | POA: Diagnosis not present

## 2017-12-26 DIAGNOSIS — M6281 Muscle weakness (generalized): Secondary | ICD-10-CM | POA: Diagnosis not present

## 2017-12-26 DIAGNOSIS — M25552 Pain in left hip: Secondary | ICD-10-CM | POA: Diagnosis not present

## 2017-12-26 DIAGNOSIS — M62559 Muscle wasting and atrophy, not elsewhere classified, unspecified thigh: Secondary | ICD-10-CM | POA: Diagnosis not present

## 2017-12-26 DIAGNOSIS — M545 Low back pain: Secondary | ICD-10-CM | POA: Diagnosis not present

## 2017-12-29 DIAGNOSIS — M545 Low back pain: Secondary | ICD-10-CM | POA: Diagnosis not present

## 2017-12-29 DIAGNOSIS — M62559 Muscle wasting and atrophy, not elsewhere classified, unspecified thigh: Secondary | ICD-10-CM | POA: Diagnosis not present

## 2017-12-29 DIAGNOSIS — G43019 Migraine without aura, intractable, without status migrainosus: Secondary | ICD-10-CM | POA: Diagnosis not present

## 2017-12-29 DIAGNOSIS — G43719 Chronic migraine without aura, intractable, without status migrainosus: Secondary | ICD-10-CM | POA: Diagnosis not present

## 2017-12-29 DIAGNOSIS — M6281 Muscle weakness (generalized): Secondary | ICD-10-CM | POA: Diagnosis not present

## 2017-12-29 DIAGNOSIS — M25552 Pain in left hip: Secondary | ICD-10-CM | POA: Diagnosis not present

## 2018-01-02 DIAGNOSIS — M25552 Pain in left hip: Secondary | ICD-10-CM | POA: Diagnosis not present

## 2018-01-02 DIAGNOSIS — M6281 Muscle weakness (generalized): Secondary | ICD-10-CM | POA: Diagnosis not present

## 2018-01-02 DIAGNOSIS — M545 Low back pain: Secondary | ICD-10-CM | POA: Diagnosis not present

## 2018-01-02 DIAGNOSIS — M62559 Muscle wasting and atrophy, not elsewhere classified, unspecified thigh: Secondary | ICD-10-CM | POA: Diagnosis not present

## 2018-01-07 DIAGNOSIS — M62559 Muscle wasting and atrophy, not elsewhere classified, unspecified thigh: Secondary | ICD-10-CM | POA: Diagnosis not present

## 2018-01-07 DIAGNOSIS — M545 Low back pain: Secondary | ICD-10-CM | POA: Diagnosis not present

## 2018-01-07 DIAGNOSIS — M6281 Muscle weakness (generalized): Secondary | ICD-10-CM | POA: Diagnosis not present

## 2018-01-07 DIAGNOSIS — M25552 Pain in left hip: Secondary | ICD-10-CM | POA: Diagnosis not present

## 2018-01-13 DIAGNOSIS — R350 Frequency of micturition: Secondary | ICD-10-CM | POA: Diagnosis not present

## 2018-01-13 DIAGNOSIS — N3941 Urge incontinence: Secondary | ICD-10-CM | POA: Diagnosis not present

## 2018-01-21 DIAGNOSIS — E559 Vitamin D deficiency, unspecified: Secondary | ICD-10-CM | POA: Diagnosis not present

## 2018-01-21 DIAGNOSIS — E7800 Pure hypercholesterolemia, unspecified: Secondary | ICD-10-CM | POA: Diagnosis not present

## 2018-01-26 DIAGNOSIS — M9904 Segmental and somatic dysfunction of sacral region: Secondary | ICD-10-CM | POA: Diagnosis not present

## 2018-01-26 DIAGNOSIS — M545 Low back pain: Secondary | ICD-10-CM | POA: Diagnosis not present

## 2018-02-03 DIAGNOSIS — N3941 Urge incontinence: Secondary | ICD-10-CM | POA: Diagnosis not present

## 2018-02-05 DIAGNOSIS — B351 Tinea unguium: Secondary | ICD-10-CM | POA: Diagnosis not present

## 2018-02-18 DIAGNOSIS — M48061 Spinal stenosis, lumbar region without neurogenic claudication: Secondary | ICD-10-CM | POA: Diagnosis not present

## 2018-02-18 DIAGNOSIS — M545 Low back pain: Secondary | ICD-10-CM | POA: Diagnosis not present

## 2018-02-18 DIAGNOSIS — M5136 Other intervertebral disc degeneration, lumbar region: Secondary | ICD-10-CM | POA: Diagnosis not present

## 2018-02-24 DIAGNOSIS — N3941 Urge incontinence: Secondary | ICD-10-CM | POA: Diagnosis not present

## 2018-03-09 DIAGNOSIS — H2513 Age-related nuclear cataract, bilateral: Secondary | ICD-10-CM | POA: Diagnosis not present

## 2018-03-17 DIAGNOSIS — R350 Frequency of micturition: Secondary | ICD-10-CM | POA: Diagnosis not present

## 2018-03-20 DIAGNOSIS — L820 Inflamed seborrheic keratosis: Secondary | ICD-10-CM | POA: Diagnosis not present

## 2018-03-20 DIAGNOSIS — L570 Actinic keratosis: Secondary | ICD-10-CM | POA: Diagnosis not present

## 2018-03-20 DIAGNOSIS — L821 Other seborrheic keratosis: Secondary | ICD-10-CM | POA: Diagnosis not present

## 2018-03-20 DIAGNOSIS — Z85828 Personal history of other malignant neoplasm of skin: Secondary | ICD-10-CM | POA: Diagnosis not present

## 2018-04-28 DIAGNOSIS — R350 Frequency of micturition: Secondary | ICD-10-CM | POA: Diagnosis not present

## 2018-04-28 DIAGNOSIS — N3941 Urge incontinence: Secondary | ICD-10-CM | POA: Diagnosis not present

## 2018-05-19 DIAGNOSIS — R350 Frequency of micturition: Secondary | ICD-10-CM | POA: Diagnosis not present

## 2018-05-19 DIAGNOSIS — N3941 Urge incontinence: Secondary | ICD-10-CM | POA: Diagnosis not present

## 2018-06-16 DIAGNOSIS — N3941 Urge incontinence: Secondary | ICD-10-CM | POA: Diagnosis not present

## 2018-07-14 DIAGNOSIS — N3941 Urge incontinence: Secondary | ICD-10-CM | POA: Diagnosis not present

## 2018-07-14 DIAGNOSIS — R350 Frequency of micturition: Secondary | ICD-10-CM | POA: Diagnosis not present

## 2018-07-27 DIAGNOSIS — G43719 Chronic migraine without aura, intractable, without status migrainosus: Secondary | ICD-10-CM | POA: Diagnosis not present

## 2018-07-27 DIAGNOSIS — G43019 Migraine without aura, intractable, without status migrainosus: Secondary | ICD-10-CM | POA: Diagnosis not present

## 2018-08-11 DIAGNOSIS — R350 Frequency of micturition: Secondary | ICD-10-CM | POA: Diagnosis not present

## 2018-08-11 DIAGNOSIS — N3941 Urge incontinence: Secondary | ICD-10-CM | POA: Diagnosis not present

## 2018-08-21 DIAGNOSIS — Z1231 Encounter for screening mammogram for malignant neoplasm of breast: Secondary | ICD-10-CM | POA: Diagnosis not present

## 2018-08-24 DIAGNOSIS — Z23 Encounter for immunization: Secondary | ICD-10-CM | POA: Diagnosis not present

## 2018-09-02 DIAGNOSIS — E7800 Pure hypercholesterolemia, unspecified: Secondary | ICD-10-CM | POA: Diagnosis not present

## 2018-09-02 DIAGNOSIS — E559 Vitamin D deficiency, unspecified: Secondary | ICD-10-CM | POA: Diagnosis not present

## 2018-09-08 DIAGNOSIS — E7800 Pure hypercholesterolemia, unspecified: Secondary | ICD-10-CM | POA: Diagnosis not present

## 2018-09-08 DIAGNOSIS — M810 Age-related osteoporosis without current pathological fracture: Secondary | ICD-10-CM | POA: Diagnosis not present

## 2018-09-08 DIAGNOSIS — Z0000 Encounter for general adult medical examination without abnormal findings: Secondary | ICD-10-CM | POA: Diagnosis not present

## 2018-09-08 DIAGNOSIS — Z1389 Encounter for screening for other disorder: Secondary | ICD-10-CM | POA: Diagnosis not present

## 2018-09-08 DIAGNOSIS — R350 Frequency of micturition: Secondary | ICD-10-CM | POA: Diagnosis not present

## 2018-09-08 DIAGNOSIS — E559 Vitamin D deficiency, unspecified: Secondary | ICD-10-CM | POA: Diagnosis not present

## 2018-09-08 DIAGNOSIS — N3941 Urge incontinence: Secondary | ICD-10-CM | POA: Diagnosis not present

## 2018-09-16 ENCOUNTER — Other Ambulatory Visit: Payer: Self-pay | Admitting: Internal Medicine

## 2018-09-16 DIAGNOSIS — K219 Gastro-esophageal reflux disease without esophagitis: Secondary | ICD-10-CM | POA: Diagnosis not present

## 2018-09-16 DIAGNOSIS — M810 Age-related osteoporosis without current pathological fracture: Secondary | ICD-10-CM | POA: Diagnosis not present

## 2018-09-23 ENCOUNTER — Other Ambulatory Visit: Payer: Self-pay | Admitting: Internal Medicine

## 2018-09-23 DIAGNOSIS — M810 Age-related osteoporosis without current pathological fracture: Secondary | ICD-10-CM

## 2018-10-06 DIAGNOSIS — N3941 Urge incontinence: Secondary | ICD-10-CM | POA: Diagnosis not present

## 2018-11-03 DIAGNOSIS — R350 Frequency of micturition: Secondary | ICD-10-CM | POA: Diagnosis not present

## 2018-11-03 DIAGNOSIS — N3941 Urge incontinence: Secondary | ICD-10-CM | POA: Diagnosis not present

## 2018-11-16 ENCOUNTER — Ambulatory Visit
Admission: RE | Admit: 2018-11-16 | Discharge: 2018-11-16 | Disposition: A | Discharge status: Home / Self Care | Payer: Medicare Other | Source: Ambulatory Visit | Attending: Internal Medicine | Admitting: Internal Medicine

## 2018-11-16 DIAGNOSIS — Z780 Asymptomatic menopausal state: Secondary | ICD-10-CM | POA: Diagnosis not present

## 2018-11-16 DIAGNOSIS — M810 Age-related osteoporosis without current pathological fracture: Secondary | ICD-10-CM

## 2018-11-16 DIAGNOSIS — M8589 Other specified disorders of bone density and structure, multiple sites: Secondary | ICD-10-CM | POA: Diagnosis not present

## 2018-12-01 DIAGNOSIS — N3941 Urge incontinence: Secondary | ICD-10-CM | POA: Diagnosis not present

## 2018-12-01 DIAGNOSIS — R350 Frequency of micturition: Secondary | ICD-10-CM | POA: Diagnosis not present

## 2018-12-22 DIAGNOSIS — N3941 Urge incontinence: Secondary | ICD-10-CM | POA: Diagnosis not present

## 2018-12-22 DIAGNOSIS — R350 Frequency of micturition: Secondary | ICD-10-CM | POA: Diagnosis not present

## 2019-06-15 IMAGING — XA Imaging study
2 series · 2 of 2 positions shown · non-contrast
Comparison: none

CLINICAL DATA: Lumbosacral spondylosis without myelopathy. Chronic
left low back scratch sec chronic low back and left buttock pain.
Worsening radicular pain in the lateral aspect of the left leg.
Multilevel lumbar disc and facet degeneration resulting in left
lateral recess stenosis at L4-5.

[Series 1: ortho standard · 1 of 1 slices shown (1 of 2)]
[im 1/1]
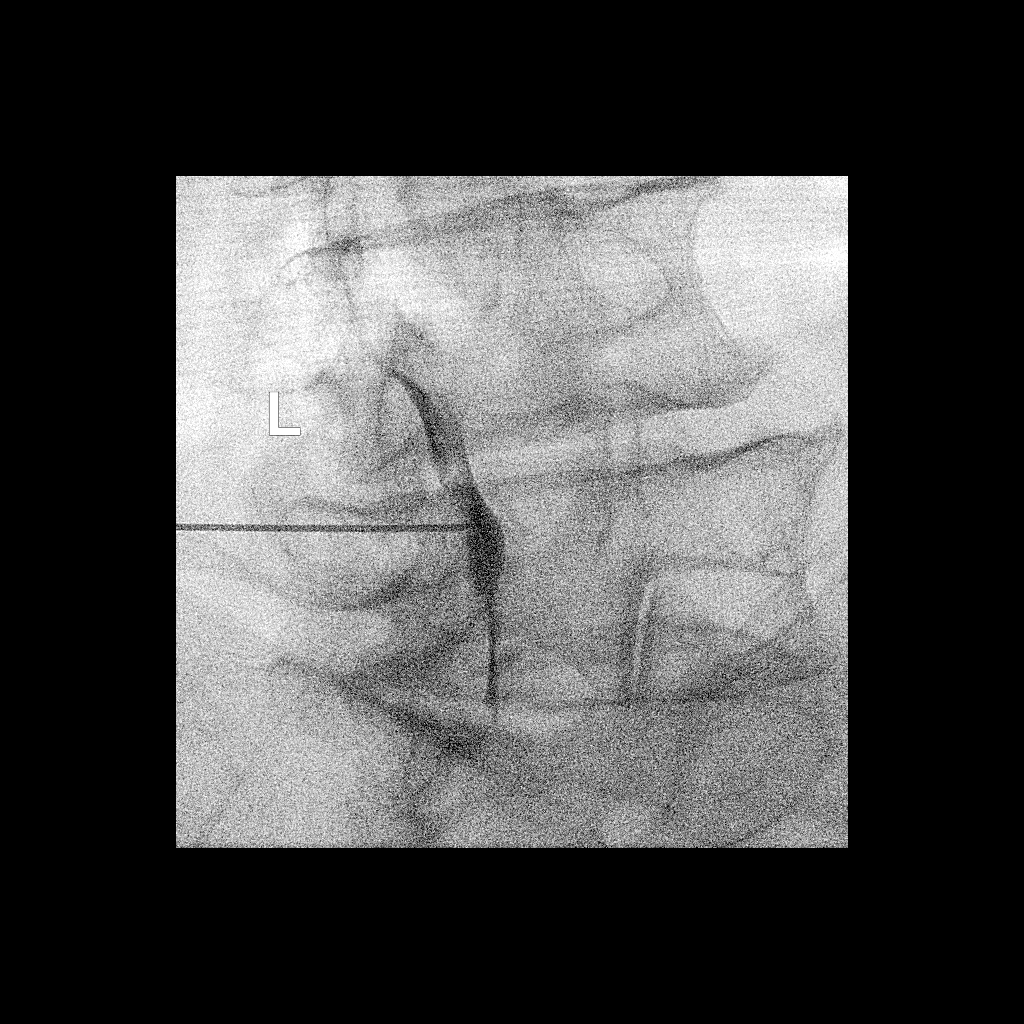

[Series 2: ortho standard · 1 of 1 slices shown (2 of 2)]
[im 1/1]
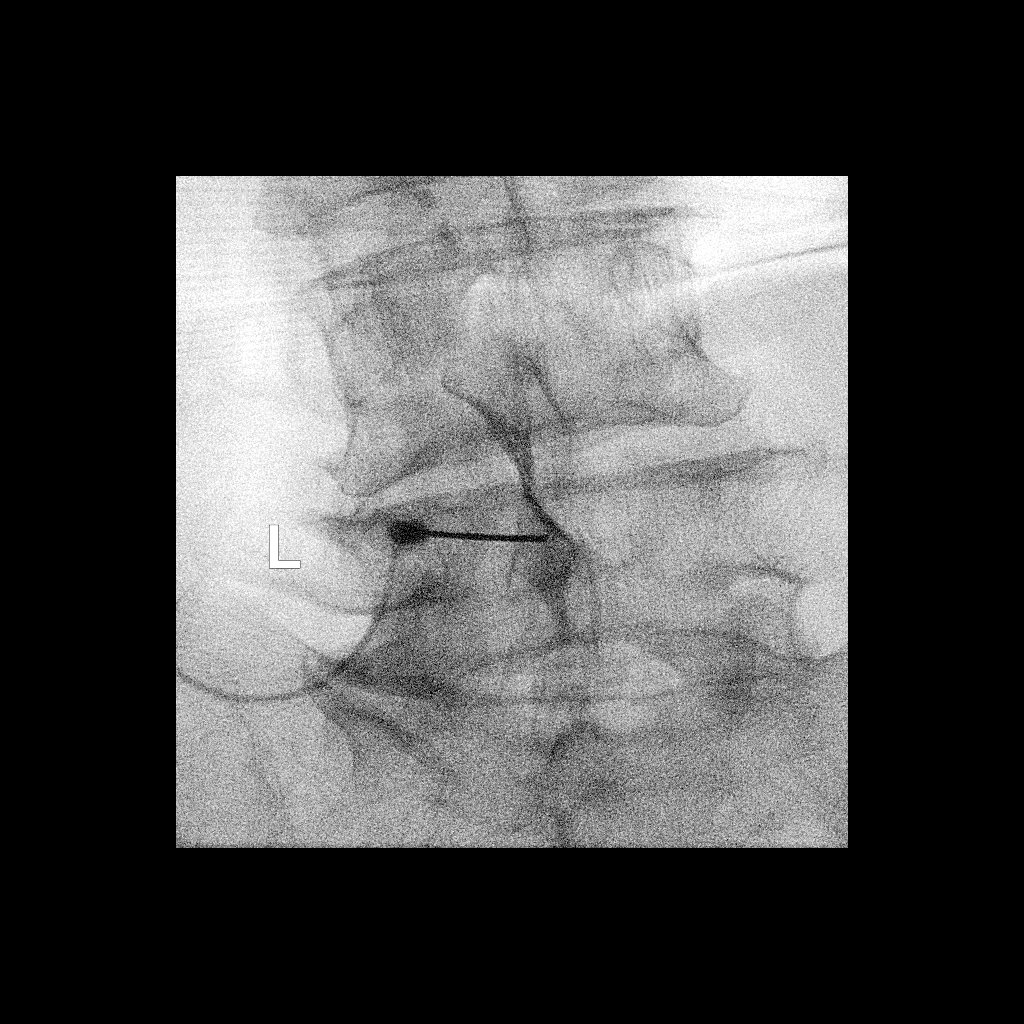

[2 of 2 positions shown; findings below may reference images not displayed]

FLUOROSCOPY TIME:  Radiation Exposure Index (as provided by the
fluoroscopic device): 9.02 microGray*m^2

Fluoroscopy Time (in minutes and seconds):  9 seconds

PROCEDURE:
The procedure, risks, benefits, and alternatives were explained to
the patient. Questions regarding the procedure were encouraged and
answered. The patient understands and consents to the procedure.

LUMBAR EPIDURAL INJECTION:

An interlaminar approach was performed on the left at L4-5. The
overlying skin was cleansed and anesthetized. A 3.5 inch 20 gauge
epidural needle was advanced using loss-of-resistance technique.

DIAGNOSTIC EPIDURAL INJECTION:

Injection of Isovue-M 200 shows a good epidural pattern with spread
above and below the level of needle placement, primarily on the
left. No vascular opacification is seen.

THERAPEUTIC EPIDURAL INJECTION:

120 Mg of Depo-Medrol mixed with 3 mL of 1% lidocaine were
instilled. The procedure was well-tolerated, and the patient was
discharged thirty minutes following the injection in good condition.

COMPLICATIONS:
None
IMPRESSION: Technically successful lumbar interlaminar epidural injection on the
left at L4-5.

## 2019-08-05 DIAGNOSIS — Z23 Encounter for immunization: Secondary | ICD-10-CM | POA: Diagnosis not present

## 2019-09-07 DIAGNOSIS — Z1231 Encounter for screening mammogram for malignant neoplasm of breast: Secondary | ICD-10-CM | POA: Diagnosis not present

## 2019-09-09 DIAGNOSIS — G43719 Chronic migraine without aura, intractable, without status migrainosus: Secondary | ICD-10-CM | POA: Diagnosis not present

## 2019-09-09 DIAGNOSIS — G43019 Migraine without aura, intractable, without status migrainosus: Secondary | ICD-10-CM | POA: Diagnosis not present

## 2019-09-10 DIAGNOSIS — M810 Age-related osteoporosis without current pathological fracture: Secondary | ICD-10-CM | POA: Diagnosis not present

## 2019-09-10 DIAGNOSIS — K219 Gastro-esophageal reflux disease without esophagitis: Secondary | ICD-10-CM | POA: Diagnosis not present

## 2019-09-10 DIAGNOSIS — Z0000 Encounter for general adult medical examination without abnormal findings: Secondary | ICD-10-CM | POA: Diagnosis not present

## 2019-09-10 DIAGNOSIS — E559 Vitamin D deficiency, unspecified: Secondary | ICD-10-CM | POA: Diagnosis not present

## 2019-09-17 DIAGNOSIS — N6001 Solitary cyst of right breast: Secondary | ICD-10-CM | POA: Diagnosis not present

## 2019-09-17 DIAGNOSIS — N6311 Unspecified lump in the right breast, upper outer quadrant: Secondary | ICD-10-CM | POA: Diagnosis not present

## 2019-09-21 DIAGNOSIS — L309 Dermatitis, unspecified: Secondary | ICD-10-CM | POA: Diagnosis not present

## 2019-09-21 DIAGNOSIS — L820 Inflamed seborrheic keratosis: Secondary | ICD-10-CM | POA: Diagnosis not present

## 2019-09-21 DIAGNOSIS — Z85828 Personal history of other malignant neoplasm of skin: Secondary | ICD-10-CM | POA: Diagnosis not present

## 2019-09-21 DIAGNOSIS — L239 Allergic contact dermatitis, unspecified cause: Secondary | ICD-10-CM | POA: Diagnosis not present

## 2019-09-21 DIAGNOSIS — C44722 Squamous cell carcinoma of skin of right lower limb, including hip: Secondary | ICD-10-CM | POA: Diagnosis not present

## 2020-01-19 DIAGNOSIS — L821 Other seborrheic keratosis: Secondary | ICD-10-CM | POA: Diagnosis not present

## 2020-01-19 DIAGNOSIS — L820 Inflamed seborrheic keratosis: Secondary | ICD-10-CM | POA: Diagnosis not present

## 2020-01-19 DIAGNOSIS — Z85828 Personal history of other malignant neoplasm of skin: Secondary | ICD-10-CM | POA: Diagnosis not present

## 2020-03-07 DIAGNOSIS — G43719 Chronic migraine without aura, intractable, without status migrainosus: Secondary | ICD-10-CM | POA: Diagnosis not present

## 2020-03-07 DIAGNOSIS — G43019 Migraine without aura, intractable, without status migrainosus: Secondary | ICD-10-CM | POA: Diagnosis not present

## 2020-03-17 ENCOUNTER — Ambulatory Visit: Payer: Medicare Other | Admitting: Podiatry

## 2020-03-21 DIAGNOSIS — N6001 Solitary cyst of right breast: Secondary | ICD-10-CM | POA: Diagnosis not present

## 2020-03-31 ENCOUNTER — Other Ambulatory Visit: Payer: Self-pay

## 2020-03-31 ENCOUNTER — Ambulatory Visit: Payer: Medicare Other

## 2020-03-31 ENCOUNTER — Ambulatory Visit: Payer: Medicare Other | Admitting: Podiatry

## 2020-03-31 VITALS — Temp 96.9°F

## 2020-03-31 DIAGNOSIS — B351 Tinea unguium: Secondary | ICD-10-CM | POA: Diagnosis not present

## 2020-03-31 MED ORDER — CICLOPIROX 8 % EX SOLN: Freq: Every day | CUTANEOUS | 0 refills | Status: DC

## 2020-04-13 DIAGNOSIS — H402234 Chronic angle-closure glaucoma, bilateral, indeterminate stage: Secondary | ICD-10-CM | POA: Diagnosis not present

## 2020-04-14 DIAGNOSIS — H401130 Primary open-angle glaucoma, bilateral, stage unspecified: Secondary | ICD-10-CM | POA: Diagnosis not present

## 2020-04-17 DIAGNOSIS — H401130 Primary open-angle glaucoma, bilateral, stage unspecified: Secondary | ICD-10-CM | POA: Diagnosis not present

## 2020-04-19 DIAGNOSIS — L820 Inflamed seborrheic keratosis: Secondary | ICD-10-CM | POA: Diagnosis not present

## 2020-04-19 DIAGNOSIS — L03011 Cellulitis of right finger: Secondary | ICD-10-CM | POA: Diagnosis not present

## 2020-04-19 DIAGNOSIS — Z85828 Personal history of other malignant neoplasm of skin: Secondary | ICD-10-CM | POA: Diagnosis not present

## 2020-04-27 NOTE — Progress Notes (Signed)
  Subjective:  Patient ID: Rachel Spencer, female    DOB: 10-Nov-1933,  MRN: [id number]  Chief Complaint  Patient presents with  . Nail Problem    Thick, long toenails. History of bilateral hallux nail avulsion. Pt stated, "I had nail fungus. The 2nd toes and the R third toe are the worst. If I trim the R 3rd toenail, it's very painful - it will bother me for a few weeks".    86 y.o. female presents with the above complaint. History confirmed with patient.   Objective:  Physical Exam: warm, good capillary refill, no trophic changes or ulcerative lesions, normal DP and PT pulses and normal sensory exam. Nails dystrophic and painful. Left Foot: normal exam, no swelling, tenderness, instability; ligaments intact, full range of motion of all ankle/foot joints  Right Foot: normal exam, no swelling, tenderness, instability; ligaments intact, full range of motion of all ankle/foot joints   Assessment:   1. Onychomycosis      Plan:  Patient was evaluated and treated and all questions answered.  Onychomycosis -Educated on etiology of nail fungus. -Rx Penlac -F/u in 2 months for recheck.    No follow-ups on file.

## 2020-05-08 DIAGNOSIS — R531 Weakness: Secondary | ICD-10-CM | POA: Diagnosis not present

## 2020-05-08 DIAGNOSIS — R42 Dizziness and giddiness: Secondary | ICD-10-CM | POA: Diagnosis not present

## 2020-05-08 DIAGNOSIS — E559 Vitamin D deficiency, unspecified: Secondary | ICD-10-CM | POA: Diagnosis not present

## 2020-05-08 DIAGNOSIS — M810 Age-related osteoporosis without current pathological fracture: Secondary | ICD-10-CM | POA: Diagnosis not present

## 2020-05-12 ENCOUNTER — Other Ambulatory Visit: Payer: Self-pay

## 2020-05-12 ENCOUNTER — Ambulatory Visit: Payer: Medicare Other | Admitting: Podiatry

## 2020-05-23 DIAGNOSIS — H401113 Primary open-angle glaucoma, right eye, severe stage: Secondary | ICD-10-CM | POA: Diagnosis not present

## 2020-05-23 DIAGNOSIS — M48061 Spinal stenosis, lumbar region without neurogenic claudication: Secondary | ICD-10-CM | POA: Diagnosis not present

## 2020-05-23 DIAGNOSIS — M5136 Other intervertebral disc degeneration, lumbar region: Secondary | ICD-10-CM | POA: Diagnosis not present

## 2020-05-23 DIAGNOSIS — M5030 Other cervical disc degeneration, unspecified cervical region: Secondary | ICD-10-CM | POA: Diagnosis not present

## 2020-05-29 DIAGNOSIS — H401113 Primary open-angle glaucoma, right eye, severe stage: Secondary | ICD-10-CM | POA: Diagnosis not present

## 2020-06-14 DIAGNOSIS — H401413 Capsular glaucoma with pseudoexfoliation of lens, right eye, severe stage: Secondary | ICD-10-CM | POA: Diagnosis not present

## 2020-06-15 DIAGNOSIS — M5136 Other intervertebral disc degeneration, lumbar region: Secondary | ICD-10-CM | POA: Diagnosis not present

## 2020-07-05 DIAGNOSIS — Z85828 Personal history of other malignant neoplasm of skin: Secondary | ICD-10-CM | POA: Diagnosis not present

## 2020-07-05 DIAGNOSIS — D220 Melanocytic nevi of lip: Secondary | ICD-10-CM | POA: Diagnosis not present

## 2020-07-05 DIAGNOSIS — L820 Inflamed seborrheic keratosis: Secondary | ICD-10-CM | POA: Diagnosis not present

## 2020-07-12 DIAGNOSIS — R319 Hematuria, unspecified: Secondary | ICD-10-CM | POA: Diagnosis not present

## 2020-07-12 DIAGNOSIS — H401131 Primary open-angle glaucoma, bilateral, mild stage: Secondary | ICD-10-CM | POA: Diagnosis not present

## 2020-07-12 DIAGNOSIS — N3000 Acute cystitis without hematuria: Secondary | ICD-10-CM | POA: Diagnosis not present

## 2020-08-21 DIAGNOSIS — H25813 Combined forms of age-related cataract, bilateral: Secondary | ICD-10-CM | POA: Diagnosis not present

## 2020-08-21 DIAGNOSIS — H401413 Capsular glaucoma with pseudoexfoliation of lens, right eye, severe stage: Secondary | ICD-10-CM | POA: Diagnosis not present

## 2020-08-21 DIAGNOSIS — H04123 Dry eye syndrome of bilateral lacrimal glands: Secondary | ICD-10-CM | POA: Diagnosis not present

## 2020-08-30 DIAGNOSIS — G43719 Chronic migraine without aura, intractable, without status migrainosus: Secondary | ICD-10-CM | POA: Diagnosis not present

## 2020-08-30 DIAGNOSIS — G43019 Migraine without aura, intractable, without status migrainosus: Secondary | ICD-10-CM | POA: Diagnosis not present

## 2020-09-11 DIAGNOSIS — H04123 Dry eye syndrome of bilateral lacrimal glands: Secondary | ICD-10-CM | POA: Diagnosis not present

## 2020-09-11 DIAGNOSIS — Z0000 Encounter for general adult medical examination without abnormal findings: Secondary | ICD-10-CM | POA: Diagnosis not present

## 2020-09-11 DIAGNOSIS — E559 Vitamin D deficiency, unspecified: Secondary | ICD-10-CM | POA: Diagnosis not present

## 2020-09-11 DIAGNOSIS — H25813 Combined forms of age-related cataract, bilateral: Secondary | ICD-10-CM | POA: Diagnosis not present

## 2020-09-11 DIAGNOSIS — M810 Age-related osteoporosis without current pathological fracture: Secondary | ICD-10-CM | POA: Diagnosis not present

## 2020-09-11 DIAGNOSIS — E7800 Pure hypercholesterolemia, unspecified: Secondary | ICD-10-CM | POA: Diagnosis not present

## 2020-09-11 DIAGNOSIS — H401413 Capsular glaucoma with pseudoexfoliation of lens, right eye, severe stage: Secondary | ICD-10-CM | POA: Diagnosis not present

## 2020-09-12 DIAGNOSIS — Z1231 Encounter for screening mammogram for malignant neoplasm of breast: Secondary | ICD-10-CM | POA: Diagnosis not present

## 2020-09-27 DIAGNOSIS — M810 Age-related osteoporosis without current pathological fracture: Secondary | ICD-10-CM | POA: Diagnosis not present

## 2020-09-27 DIAGNOSIS — E7800 Pure hypercholesterolemia, unspecified: Secondary | ICD-10-CM | POA: Diagnosis not present

## 2020-09-27 DIAGNOSIS — Z0000 Encounter for general adult medical examination without abnormal findings: Secondary | ICD-10-CM | POA: Diagnosis not present

## 2020-09-27 DIAGNOSIS — E559 Vitamin D deficiency, unspecified: Secondary | ICD-10-CM | POA: Diagnosis not present

## 2020-10-02 ENCOUNTER — Other Ambulatory Visit: Payer: Self-pay | Admitting: Internal Medicine

## 2020-10-02 DIAGNOSIS — K219 Gastro-esophageal reflux disease without esophagitis: Secondary | ICD-10-CM | POA: Diagnosis not present

## 2020-10-02 DIAGNOSIS — M810 Age-related osteoporosis without current pathological fracture: Secondary | ICD-10-CM | POA: Diagnosis not present

## 2020-10-05 DIAGNOSIS — H2511 Age-related nuclear cataract, right eye: Secondary | ICD-10-CM | POA: Diagnosis not present

## 2020-10-05 DIAGNOSIS — H401413 Capsular glaucoma with pseudoexfoliation of lens, right eye, severe stage: Secondary | ICD-10-CM | POA: Diagnosis not present

## 2020-10-05 DIAGNOSIS — H401113 Primary open-angle glaucoma, right eye, severe stage: Secondary | ICD-10-CM | POA: Diagnosis not present

## 2020-11-02 DIAGNOSIS — J029 Acute pharyngitis, unspecified: Secondary | ICD-10-CM | POA: Diagnosis not present

## 2020-11-02 DIAGNOSIS — U071 COVID-19: Secondary | ICD-10-CM | POA: Diagnosis not present

## 2020-11-02 DIAGNOSIS — R0981 Nasal congestion: Secondary | ICD-10-CM | POA: Diagnosis not present

## 2020-11-02 DIAGNOSIS — Z20822 Contact with and (suspected) exposure to covid-19: Secondary | ICD-10-CM | POA: Diagnosis not present

## 2020-11-02 DIAGNOSIS — R059 Cough, unspecified: Secondary | ICD-10-CM | POA: Diagnosis not present

## 2020-11-03 ENCOUNTER — Other Ambulatory Visit (HOSPITAL_COMMUNITY): Payer: Self-pay

## 2020-11-23 ENCOUNTER — Other Ambulatory Visit: Payer: Medicare Other

## 2020-12-15 DIAGNOSIS — M810 Age-related osteoporosis without current pathological fracture: Secondary | ICD-10-CM | POA: Diagnosis not present

## 2020-12-19 ENCOUNTER — Other Ambulatory Visit: Payer: Self-pay

## 2020-12-19 ENCOUNTER — Ambulatory Visit
Admission: RE | Admit: 2020-12-19 | Discharge: 2020-12-19 | Disposition: A | Discharge status: Home / Self Care | Payer: Medicare Other | Source: Ambulatory Visit | Attending: Internal Medicine | Admitting: Internal Medicine

## 2020-12-19 DIAGNOSIS — Z780 Asymptomatic menopausal state: Secondary | ICD-10-CM | POA: Diagnosis not present

## 2020-12-19 DIAGNOSIS — M810 Age-related osteoporosis without current pathological fracture: Secondary | ICD-10-CM

## 2020-12-19 DIAGNOSIS — M8589 Other specified disorders of bone density and structure, multiple sites: Secondary | ICD-10-CM | POA: Diagnosis not present

## 2020-12-25 DIAGNOSIS — H25812 Combined forms of age-related cataract, left eye: Secondary | ICD-10-CM | POA: Diagnosis not present

## 2020-12-25 DIAGNOSIS — H2589 Other age-related cataract: Secondary | ICD-10-CM | POA: Diagnosis not present

## 2020-12-25 DIAGNOSIS — H401413 Capsular glaucoma with pseudoexfoliation of lens, right eye, severe stage: Secondary | ICD-10-CM | POA: Diagnosis not present

## 2020-12-25 DIAGNOSIS — H04123 Dry eye syndrome of bilateral lacrimal glands: Secondary | ICD-10-CM | POA: Diagnosis not present

## 2021-01-04 ENCOUNTER — Other Ambulatory Visit (HOSPITAL_COMMUNITY): Payer: Self-pay | Admitting: *Deleted

## 2021-01-04 ENCOUNTER — Encounter (HOSPITAL_COMMUNITY): Payer: Medicare Other

## 2021-01-05 ENCOUNTER — Encounter (HOSPITAL_COMMUNITY): Payer: Medicare Other

## 2021-01-05 ENCOUNTER — Ambulatory Visit (HOSPITAL_COMMUNITY)
Admission: RE | Admit: 2021-01-05 | Discharge: 2021-01-05 | Disposition: A | Discharge status: Home / Self Care | Payer: Medicare Other | Source: Ambulatory Visit | Attending: Internal Medicine | Admitting: Internal Medicine

## 2021-01-05 ENCOUNTER — Other Ambulatory Visit: Payer: Self-pay

## 2021-01-05 DIAGNOSIS — M810 Age-related osteoporosis without current pathological fracture: Secondary | ICD-10-CM | POA: Diagnosis not present

## 2021-01-05 MED ORDER — ZOLEDRONIC ACID 5 MG/100ML IV SOLN: 5.0000 mg | Freq: Once | INTRAVENOUS | Status: AC

## 2021-01-05 MED ORDER — ZOLEDRONIC ACID 5 MG/100ML IV SOLN
INTRAVENOUS | Status: AC
  Administered 2021-01-05: 5 mg via INTRAVENOUS
  Filled 2021-01-05: qty 100

## 2021-02-20 DIAGNOSIS — H401121 Primary open-angle glaucoma, left eye, mild stage: Secondary | ICD-10-CM | POA: Diagnosis not present

## 2021-02-20 DIAGNOSIS — H04123 Dry eye syndrome of bilateral lacrimal glands: Secondary | ICD-10-CM | POA: Diagnosis not present

## 2021-02-20 DIAGNOSIS — H401413 Capsular glaucoma with pseudoexfoliation of lens, right eye, severe stage: Secondary | ICD-10-CM | POA: Diagnosis not present

## 2021-02-20 DIAGNOSIS — H25812 Combined forms of age-related cataract, left eye: Secondary | ICD-10-CM | POA: Diagnosis not present

## 2021-02-21 DIAGNOSIS — G43019 Migraine without aura, intractable, without status migrainosus: Secondary | ICD-10-CM | POA: Diagnosis not present

## 2021-02-21 DIAGNOSIS — G43719 Chronic migraine without aura, intractable, without status migrainosus: Secondary | ICD-10-CM | POA: Diagnosis not present

## 2021-03-28 DIAGNOSIS — I480 Paroxysmal atrial fibrillation: Secondary | ICD-10-CM

## 2021-03-28 DIAGNOSIS — H2512 Age-related nuclear cataract, left eye: Secondary | ICD-10-CM | POA: Diagnosis not present

## 2021-03-28 HISTORY — DX: Paroxysmal atrial fibrillation: I48.0

## 2021-03-29 ENCOUNTER — Emergency Department (HOSPITAL_COMMUNITY)
Admission: EM | Admit: 2021-03-29 | Discharge: 2021-03-29 | Disposition: A | Discharge status: Home / Self Care | Payer: Medicare Other | Attending: Emergency Medicine | Admitting: Emergency Medicine

## 2021-03-29 ENCOUNTER — Encounter (HOSPITAL_COMMUNITY): Payer: Self-pay | Admitting: Emergency Medicine

## 2021-03-29 ENCOUNTER — Emergency Department (HOSPITAL_COMMUNITY): Payer: Medicare Other

## 2021-03-29 DIAGNOSIS — H401121 Primary open-angle glaucoma, left eye, mild stage: Secondary | ICD-10-CM | POA: Diagnosis not present

## 2021-03-29 DIAGNOSIS — I4892 Unspecified atrial flutter: Secondary | ICD-10-CM | POA: Insufficient documentation

## 2021-03-29 DIAGNOSIS — I4891 Unspecified atrial fibrillation: Secondary | ICD-10-CM

## 2021-03-29 DIAGNOSIS — I10 Essential (primary) hypertension: Secondary | ICD-10-CM | POA: Diagnosis not present

## 2021-03-29 DIAGNOSIS — H2512 Age-related nuclear cataract, left eye: Secondary | ICD-10-CM | POA: Diagnosis not present

## 2021-03-29 DIAGNOSIS — R000 Tachycardia, unspecified: Secondary | ICD-10-CM | POA: Diagnosis not present

## 2021-03-29 DIAGNOSIS — R0902 Hypoxemia: Secondary | ICD-10-CM | POA: Diagnosis not present

## 2021-03-29 LAB — CBC WITH DIFFERENTIAL/PLATELET
Abs Immature Granulocytes: 0.01 10*3/uL (ref 0.00–0.07)
Basophils Absolute: 0 10*3/uL (ref 0.0–0.1)
Basophils Relative: 1 %
Eosinophils Absolute: 0.1 10*3/uL (ref 0.0–0.5)
Eosinophils Relative: 1 %
HCT: 45.8 % (ref 36.0–46.0)
Hemoglobin: 14.7 g/dL (ref 12.0–15.0)
Immature Granulocytes: 0 %
Lymphocytes Relative: 25 %
Lymphs Abs: 1.2 10*3/uL (ref 0.7–4.0)
MCH: 30.4 pg (ref 26.0–34.0)
MCHC: 32.1 g/dL (ref 30.0–36.0)
MCV: 94.8 fL (ref 80.0–100.0)
Monocytes Absolute: 0.4 10*3/uL (ref 0.1–1.0)
Monocytes Relative: 8 %
Neutro Abs: 3.1 10*3/uL (ref 1.7–7.7)
Neutrophils Relative %: 65 %
Platelets: 190 10*3/uL (ref 150–400)
RBC: 4.83 MIL/uL (ref 3.87–5.11)
RDW: 11.9 % (ref 11.5–15.5)
WBC: 4.8 10*3/uL (ref 4.0–10.5)
nRBC: 0 % (ref 0.0–0.2)

## 2021-03-29 LAB — BASIC METABOLIC PANEL
Anion gap: 5 (ref 5–15)
BUN: 20 mg/dL (ref 8–23)
CO2: 28 mmol/L (ref 22–32)
Calcium: 9 mg/dL (ref 8.9–10.3)
Chloride: 105 mmol/L (ref 98–111)
Creatinine, Ser: 0.66 mg/dL (ref 0.44–1.00)
GFR, Estimated: >60 mL/min (ref >60)
Glucose, Bld: 123 mg/dL — ABNORMAL HIGH (ref 70–99)
Potassium: 3.7 mmol/L (ref 3.5–5.1)
Sodium: 138 mmol/L (ref 135–145)

## 2021-03-29 LAB — TSH: TSH: 1.323 u[IU]/mL (ref 0.350–4.500)

## 2021-03-29 LAB — MAGNESIUM: Magnesium: 2.2 mg/dL (ref 1.7–2.4)

## 2021-03-29 MED ORDER — METOPROLOL SUCCINATE ER 25 MG PO TB24: 25.0000 mg | ORAL_TABLET | Freq: Every day | ORAL | 1 refills | Status: DC

## 2021-03-29 MED ORDER — SODIUM CHLORIDE 0.9 % IV BOLUS
500.0000 mL | Freq: Once | INTRAVENOUS | Status: AC
  Administered 2021-03-29: 500 mL via INTRAVENOUS

## 2021-03-29 MED ORDER — APIXABAN 2.5 MG PO TABS: 2.5000 mg | ORAL_TABLET | Freq: Two times a day (BID) | ORAL | 0 refills | Status: DC

## 2021-03-29 MED ORDER — APIXABAN 2.5 MG PO TABS
2.5000 mg | ORAL_TABLET | Freq: Once | ORAL | Status: AC
  Administered 2021-03-29: 2.5 mg via ORAL
  Filled 2021-03-29: qty 1

## 2021-03-29 NOTE — ED Notes (Signed)
Received verbal report from Peggy at this time 

## 2021-03-29 NOTE — ED Triage Notes (Signed)
Pt arrived to ED via EMS from outpatient surgery. Pt was recovering from cataract surgery on left eye when they noticed pt HR in the 130's. Thought pt was in afib, pt was given 2.5 mg of labetalol x2, HR decreased 90-110 in AFIB. Pt denies any chest pain or sob, no previous cardiac hx. Pt only complaint at this time is nausea, she was given a total 8 mg of zofran prior to arrival.

## 2021-03-29 NOTE — Progress Notes (Signed)
   Called to see Ms Pullin for Afib, but she spontaneously converted to SR.  Spoke w/ pt and dtr in the room.   She had no palpitations, but felt nauseated w/ the Afib. That has resolved.  Has never had sx like this before.  Today's episode was detected after cataract surgery.  Eye MD has cleared her for anticoag.  Spoke w/ ER MD.  They will start low-dose BB and Eliquis per pharmacy.  Spoke w/ Dr Antoine Poche, he will make sure she gets a new pt appt very soon.  Communicated plan w/ pt and daughter, they agree.   Theodore Demark, PA-C 03/29/2021 6:28 PM

## 2021-03-29 NOTE — Discharge Instructions (Addendum)
We recommend starting the apixaban (Eliquis) as well as the metoprolol, as prescribed. You received a dose of the apixaban here in the emergency department prior to discharge.  You do not need to start this medication at home until tomorrow. We recommend close follow-up with the atrial fibrillation (A. fib) clinic.  Someone from the clinic should call you to set up this appointment.  If you have not heard from the clinic by tomorrow afternoon, please call them instead.  Return to the emergency department for chest pain, shortness of breath, palpitations, dizziness, passing out, or any other major concerns.

## 2021-03-29 NOTE — ED Provider Notes (Signed)
[facility] EMERGENCY DEPARTMENT Provider Note   CSN: [id number] Arrival date & time: 03/29/21  1431     History Chief Complaint  Patient presents with  . Atrial Fibrillation    Rachel Spencer is a 87 y.o. female.  HPI      Rachel Spencer is a 87 y.o. female, with a history of DVT, hyperlipidemia, presenting to the ED accompanied by her daughter with A. fib RVR.  Patient underwent cataract surgery on her left eye performed by Marchelle Gearing around 11 AM this morning at an outpatient surgery center.  She was under sedation, but not general anesthesia.  After the surgery, she had nausea and then vomiting.  During postop monitoring staff noted A. fib RVR.  She received a total of 5 mg labetalol IV as well as Zofran. Upon arrival in the ED, patient states her nausea has resolved.  Patient denies fever/chills, dizziness, shortness of breath, chest pain, abdominal pain, or any other complaints.  I asked the patient about her history of DVT.  She states she does not think she ever had a deep vein clot, they started her on Coumadin due to some clotting noted in varicose veins.  She was on Coumadin for several years, but has not been on Coumadin or any other anticoagulation for at least 15 years.   Past Medical History:  Diagnosis Date  . Arthritis   . DVT (deep venous thrombosis) (HCC)   . Esophageal reflux   . Hyperlipidemia   . Varicose veins     Patient Active Problem List   Diagnosis Date Noted  . DVT (deep venous thrombosis) (HCC) 04/13/2013  . Acute venous embolism and thrombosis of deep vessels of distal lower extremity (HCC) 10/13/2012  . Varicose veins of lower extremities with other complications 01/27/2012    Past Surgical History:  Procedure Laterality Date  . APPENDECTOMY    . skin grafting     on ulcers left ankle  . VEIN LIGATION AND STRIPPING       OB History   No obstetric history on file.     Family History  Problem Relation Age of  Onset  . Heart disease Mother   . Hypertension Mother   . Heart disease Father   . Hyperlipidemia Father   . Hypertension Father     Social History   Tobacco Use  . Smoking status: Never Smoker  . Smokeless tobacco: Never Used  Substance Use Topics  . Alcohol use: No  . Drug use: No    Home Medications Prior to Admission medications   Medication Sig Start Date End Date Taking? Authorizing Provider  acetaminophen (TYLENOL) 325 MG tablet Take 650 mg by mouth every 6 (six) hours as needed for mild pain.   Yes [provider]  Chlorpheniramine Maleate (EQ CHLORTABS PO) Take 1 tablet by mouth daily. 4mg    Yes [provider]  Latanoprostene Bunod (VYZULTA) 0.024 % SOLN Place 1 drop into the left eye every evening.   Yes [provider]  Melatonin 5 MG CAPS Take 1 capsule by mouth at bedtime.   Yes [provider]  metoprolol succinate (TOPROL-XL) 25 MG 24 hr tablet Take 1 tablet (25 mg total) by mouth daily. 03/29/21 05/28/21 Yes Felipe Paluch C, PA-C  topiramate (TOPAMAX) 200 MG tablet Take 100 mg by mouth daily.    Yes [provider]  apixaban (ELIQUIS) 2.5 MG TABS tablet Take 1 tablet (2.5 mg total) by mouth 2 (two)  times daily. 03/30/21 04/29/21  Cheyne Bungert, Ines Bloomer C, PA-C    Allergies    Tramadol, Codeine, Depakote [divalproex sodium], Cefadroxil, Fosamax [alendronate sodium], and Vitamin d analogs  Review of Systems   Review of Systems  Constitutional: Negative for chills and fever.  Respiratory: Negative for cough and shortness of breath.   Cardiovascular: Negative for chest pain and leg swelling.  Gastrointestinal: Positive for nausea and vomiting. Negative for abdominal pain and diarrhea.  Genitourinary: Negative for dysuria and hematuria.  Musculoskeletal: Negative for back pain.  Neurological: Negative for dizziness, syncope, weakness and numbness.  All other systems reviewed and are negative.   Physical Exam Updated Vital Signs BP (!)  135/93   Pulse 93   Temp 98 F (36.7 C) (Oral)   Resp 13   SpO2 96%   Physical Exam Vitals and nursing note reviewed.  Constitutional:      General: She is not in acute distress.    Appearance: She is well-developed. She is not diaphoretic.  HENT:     Head: Normocephalic and atraumatic.     Mouth/Throat:     Mouth: Mucous membranes are moist.     Pharynx: Oropharynx is clear.  Eyes:     Conjunctiva/sclera: Conjunctivae normal.  Cardiovascular:     Rate and Rhythm: Normal rate. Rhythm irregularly irregular.     Pulses: Normal pulses.          Radial pulses are 2+ on the right side and 2+ on the left side.       Posterior tibial pulses are 2+ on the right side and 2+ on the left side.     Heart sounds: Normal heart sounds.     Comments: Tactile temperature in the extremities appropriate and equal bilaterally. Initially, patient's heart rate seems to be well controlled, though is irregularly irregular. Pulmonary:     Effort: Pulmonary effort is normal. No respiratory distress.     Breath sounds: Normal breath sounds.  Abdominal:     Palpations: Abdomen is soft.     Tenderness: There is no abdominal tenderness. There is no guarding.  Musculoskeletal:     Cervical back: Neck supple.     Right lower leg: No edema.     Left lower leg: No edema.  Skin:    General: Skin is warm and dry.  Neurological:     Mental Status: She is alert.  Psychiatric:        Mood and Affect: Mood and affect normal.        Speech: Speech normal.        Behavior: Behavior normal.     ED Results / Procedures / Treatments   Labs (all labs ordered are listed, but only abnormal results are displayed) Labs Reviewed  BASIC METABOLIC PANEL - Abnormal; Notable for the following components:      Result Value   Glucose, Bld 123 (*)    All other components within normal limits  CBC WITH DIFFERENTIAL/PLATELET  MAGNESIUM  URINALYSIS, ROUTINE W REFLEX MICROSCOPIC  TSH    EKG EKG  Interpretation  Date/Time:  Thursday March 29 2021 14:46:47 EDT Ventricular Rate:  107 PR Interval:    QRS Duration: 72 QT Interval:  345 QTC Calculation: 461 R Axis:   38 Text Interpretation: Atrial fibrillation new Confirmed by Gwyneth Sprout ([id number]) on 03/29/2021 4:32:09 PM   Radiology DG Chest Portable 1 View  Result Date: 03/29/2021 CLINICAL DATA:  Atrial fibrillation. EXAM: PORTABLE CHEST 1 VIEW COMPARISON:  September 08, 2008.  FINDINGS: The heart size and mediastinal contours are within normal limits. Both lungs are clear. The visualized skeletal structures are unremarkable. IMPRESSION: No active disease. Aortic Atherosclerosis (ICD10-I70.0). Electronically Signed   By: Lupita Raider M.D.   On: 03/29/2021 16:00    Procedures Procedures   Medications Ordered in ED Medications  sodium chloride 0.9 % bolus 500 mL (has no administration in time range)  apixaban (ELIQUIS) tablet 2.5 mg (has no administration in time range)  sodium chloride 0.9 % bolus 500 mL (0 mLs Intravenous Stopped 03/29/21 1630)    ED Course  I have reviewed the triage vital signs and the nursing notes.  Pertinent labs & imaging results that were available during my care of the patient were reviewed by me and considered in my medical decision making (see chart for details).  Clinical Course as of 03/29/21 1922  Thu Mar 29, 2021  1652 Heart increased again.  [SJ]  1719 Spoke with Dr. Luciana Axe, ophthalmologist covering for [facility]. States there should be no issues with starting the patient on anticoagulation.  This should not affect the eye nor interfere with her recent surgery. [SJ]  1741 Spoke with Theodore Demark, PA with cardiology. States she will come see the patient. [SJ]  1759 Bjorn Loser states she spoke with patient.  Her heart rate has converted into the 60s.  Patient is still asymptomatic. She also spoke with Dr. Antoine Poche.  They will work to get the patient close follow-up in the office.   Recommend starting the patient on Eliquis and metoprolol. [SJ]  1908 Spoke with Maralyn Sago, ED pharmacist, for recommendations on Eliquis and metoprolol dosing. States based on the patient's weight and age, she would recommend starting the patient at 2.5 mg Eliquis twice daily. For the metoprolol, she would recommend metoprolol succinate starting at 25 mg once daily. [SJ]    Clinical Course User Index [SJ] Raheel Kunkle C, PA-C   MDM Rules/Calculators/A&P     CHA2DS2-VASc Score: 6                     Patient presents with A. fib RVR, presumed to be new as the patient has no knowledge of this nor does she have documented history of this. Initially rate controlled, she did return to A. fib RVR, asymptomatic. Nontoxic-appearing, not hypotensive, no apparent distress. Patient spontaneously converted to a normal sinus rhythm. Patient started on anticoagulation and beta-blocker. Patient and her daughter were given instructions for home care as well as return precautions.  Both parties voice understanding of these instructions, accept the plan, and are comfortable with discharge.  Findings and plan of care discussed with attending physician, Gwyneth Sprout, MD.   Vitals:   03/29/21 1730 03/29/21 1800 03/29/21 1830 03/29/21 1845  BP: (!) 119/92 (!) 147/63 133/66 137/64  Pulse: (!) 121 64 66 61  Resp: 11 16 15 16   Temp:      TempSrc:      SpO2: 97% 99% 99% 97%    HAS-BLED score of 1 today.    Final Clinical Impression(s) / ED Diagnoses Final diagnoses:  New onset atrial fibrillation (HCC)  Atrial fibrillation with RVR (HCC)    Rx / DC Orders ED Discharge Orders         Ordered    apixaban (ELIQUIS) 2.5 MG TABS tablet  2 times daily        03/29/21 1918    apixaban (Eliquis) per pharmacy consult       Provider:  (  Not yet assigned)   03/29/21 1810    Amb Referral to AFIB Clinic        03/29/21 1845    apixaban (ELIQUIS) 2.5 MG TABS tablet  2 times daily,   Status:  Discontinued         03/29/21 1915    metoprolol succinate (TOPROL-XL) 25 MG 24 hr tablet  Daily        03/29/21 1915           Concepcion LivingJoy, Ellisa Devivo C, PA-C 03/29/21 Filbert [id number]    Plunkett, Whitney, MD 03/30/21 [phone]

## 2021-03-29 NOTE — ED Notes (Signed)
Paged oncall ophthalmologist for PA Joy per verbal consult request.

## 2021-04-02 ENCOUNTER — Other Ambulatory Visit: Payer: Medicare Other

## 2021-04-05 ENCOUNTER — Other Ambulatory Visit: Payer: Self-pay

## 2021-04-05 ENCOUNTER — Ambulatory Visit (HOSPITAL_COMMUNITY)
Admission: RE | Admit: 2021-04-05 | Discharge: 2021-04-05 | Disposition: A | Discharge status: Home / Self Care | Payer: Medicare Other | Source: Ambulatory Visit | Attending: Physician Assistant | Admitting: Physician Assistant

## 2021-04-05 VITALS — BP 152/64 | HR 59 | Ht 63.5 in | Wt 119.4 lb

## 2021-04-05 DIAGNOSIS — Z885 Allergy status to narcotic agent status: Secondary | ICD-10-CM | POA: Insufficient documentation

## 2021-04-05 DIAGNOSIS — Z86718 Personal history of other venous thrombosis and embolism: Secondary | ICD-10-CM | POA: Diagnosis not present

## 2021-04-05 DIAGNOSIS — D6869 Other thrombophilia: Secondary | ICD-10-CM | POA: Insufficient documentation

## 2021-04-05 DIAGNOSIS — Z8249 Family history of ischemic heart disease and other diseases of the circulatory system: Secondary | ICD-10-CM | POA: Diagnosis not present

## 2021-04-05 DIAGNOSIS — Z881 Allergy status to other antibiotic agents status: Secondary | ICD-10-CM | POA: Diagnosis not present

## 2021-04-05 DIAGNOSIS — Z888 Allergy status to other drugs, medicaments and biological substances status: Secondary | ICD-10-CM | POA: Diagnosis not present

## 2021-04-05 DIAGNOSIS — Z886 Allergy status to analgesic agent status: Secondary | ICD-10-CM | POA: Diagnosis not present

## 2021-04-05 DIAGNOSIS — Z79899 Other long term (current) drug therapy: Secondary | ICD-10-CM | POA: Insufficient documentation

## 2021-04-05 DIAGNOSIS — Z7901 Long term (current) use of anticoagulants: Secondary | ICD-10-CM | POA: Diagnosis not present

## 2021-04-05 DIAGNOSIS — E785 Hyperlipidemia, unspecified: Secondary | ICD-10-CM | POA: Diagnosis not present

## 2021-04-05 DIAGNOSIS — I480 Paroxysmal atrial fibrillation: Secondary | ICD-10-CM | POA: Insufficient documentation

## 2021-04-05 DIAGNOSIS — Z8349 Family history of other endocrine, nutritional and metabolic diseases: Secondary | ICD-10-CM | POA: Diagnosis not present

## 2021-04-05 DIAGNOSIS — I4891 Unspecified atrial fibrillation: Secondary | ICD-10-CM

## 2021-04-05 HISTORY — DX: Other thrombophilia: D68.69

## 2021-04-05 HISTORY — DX: Unspecified atrial fibrillation: I48.91

## 2021-04-05 MED ORDER — APIXABAN 2.5 MG PO TABS: 2.5000 mg | ORAL_TABLET | Freq: Two times a day (BID) | ORAL | 3 refills | Status: DC

## 2021-04-05 MED ORDER — METOPROLOL SUCCINATE ER 25 MG PO TB24: 25.0000 mg | ORAL_TABLET | Freq: Every day | ORAL | 3 refills | Status: DC

## 2021-04-05 NOTE — Progress Notes (Signed)
Primary Care Physician: Juluis Rainier, MD Primary Cardiologist: none Primary Electrophysiologist: none Referring Physician: [facility]   Rachel Spencer is a 87 y.o. female with a history of DVT, HLD, and atrial fibrillation who presents for consultation in the [facility].  The patient was initially diagnosed with atrial fibrillation 03/29/21 following cataract surgery. She was in afib with RVR on arrival to the ED. She had symptoms of nausea and dizziness. She converted spontaneously in the ED. She was started on metoprolol and Eliquis for a CHADS2VASC score of 3. She has not had any heart racing or palpitations. She denies any bleeding issues on anticoagulation.   Today, she denies symptoms of palpitations, chest pain, shortness of breath, orthopnea, PND, lower extremity edema, dizziness, presyncope, syncope, snoring, daytime somnolence, bleeding, or neurologic sequela. The patient is tolerating medications without difficulties and is otherwise without complaint today.    Atrial Fibrillation Risk Factors:  she does not have symptoms or diagnosis of sleep apnea. she does not have a history of rheumatic fever. she does not have a history of alcohol use. The patient does have a history of early familial atrial fibrillation or other arrhythmias. Daughter has afib.  she has a BMI of Body mass index is 20.82 kg/m.Marland Kitchen Filed Weights   04/05/21 1424  Weight: 54.2 kg    Family History  Problem Relation Age of Onset   Heart disease Mother    Hypertension Mother    Heart disease Father    Hyperlipidemia Father    Hypertension Father      Atrial Fibrillation Management history:  Previous antiarrhythmic drugs: none Previous cardioversions: none Previous ablations: none CHADS2VASC score: 3 Anticoagulation history: Eliquis   Past Medical History:  Diagnosis Date   Arthritis    DVT (deep venous thrombosis) (HCC)    Esophageal reflux     Hyperlipidemia    Varicose veins    Past Surgical History:  Procedure Laterality Date   APPENDECTOMY     skin grafting     on ulcers left ankle   VEIN LIGATION AND STRIPPING      Current Outpatient Medications  Medication Sig Dispense Refill   acetaminophen (TYLENOL) 325 MG tablet Take 650 mg by mouth every 6 (six) hours as needed for mild pain.     apixaban (ELIQUIS) 2.5 MG TABS tablet Take 1 tablet (2.5 mg total) by mouth 2 (two) times daily. 60 tablet 0   Chlorpheniramine Maleate (EQ CHLORTABS PO) Take 1 tablet by mouth daily. 4mg      Melatonin 5 MG CAPS Take 1 capsule by mouth at bedtime.     metoprolol succinate (TOPROL-XL) 25 MG 24 hr tablet Take 1 tablet (25 mg total) by mouth daily. 30 tablet 1   topiramate (TOPAMAX) 200 MG tablet Take 100 mg by mouth daily.      No current facility-administered medications for this encounter.    Allergies  Allergen Reactions   Tramadol Anaphylaxis    Tighten up throat   Codeine Nausea And Vomiting   Depakote [Divalproex Sodium] Other (See Comments)    hallucinations   Aspirin     Upset stomach   Brimonidine Tartrate     Caused fainting and dizziness    Cefadroxil     Doesn't remember reaction   Ibuprofen     Upset stomach   Rocklatan [Netarsudil-Latanoprost]     Caused fainting and dizziness   Ropinirole    Fosamax [Alendronate Sodium] Nausea Only  Stomach upset   Vitamin D Analogs Nausea Only    Stomach upset    Social History   Socioeconomic History   Marital status: Widowed    Spouse name: Not on file   Number of children: Not on file   Years of education: Not on file   Highest education level: Not on file  Occupational History   Not on file  Tobacco Use   Smoking status: Never   Smokeless tobacco: Never  Substance and Sexual Activity   Alcohol use: No   Drug use: No   Sexual activity: Not on file  Other Topics Concern   Not on file  Social History Narrative   Not on file   Social Determinants of  Health   Financial Resource Strain: Not on file  Food Insecurity: Not on file  Transportation Needs: Not on file  Physical Activity: Not on file  Stress: Not on file  Social Connections: Not on file  Intimate Partner Violence: Not on file     ROS- All systems are reviewed and negative except as per the HPI above.  Physical Exam: Vitals:   04/05/21 1424  BP: (!) 152/64  Pulse: (!) 59  Weight: 54.2 kg  Height: 5' 3.5" (1.613 m)    GEN- The patient is a well appearing elderly female, alert and oriented x 3 today.   Head- normocephalic, atraumatic Eyes-  Sclera clear, conjunctiva pink Ears- hearing intact Oropharynx- clear Neck- supple  Lungs- Clear to ausculation bilaterally, normal work of breathing Heart- Regular rate and rhythm, no murmurs, rubs or gallops  GI- soft, NT, ND, + BS Extremities- no clubbing, cyanosis, or edema MS- no significant deformity or atrophy Skin- no rash or lesion Psych- euthymic mood, full affect Neuro- strength and sensation are intact  Wt Readings from Last 3 Encounters:  04/05/21 54.2 kg  10/03/17 59.9 kg  08/21/15 56.7 kg    EKG today demonstrates  SB Vent. rate 59 BPM PR interval 140 ms QRS duration 70 ms QT/QTcB 404/399 ms  Epic records are reviewed at length today  CHA2DS2-VASc Score = 3  The patient's score is based upon: CHF History: No HTN History: No Diabetes History: No Stroke History: No Vascular Disease History: No Age Score: 2 Gender Score: 1     ASSESSMENT AND PLAN: 1. Paroxysmal Atrial Fibrillation (ICD10:  I48.0) The patient's CHA2DS2-VASc score is 3, indicating a 3.2% annual risk of stroke.   General education about afib provided and questions answered. We also discussed her stroke risk and the risks and benefits of anticoagulation. Continue Eliquis 2.5 mg BID Continue Toprol 25 mg daily Check echocardiogram  2. Secondary Hypercoagulable State (ICD10:  D68.69) The patient is at significant risk for  stroke/thromboembolism based upon her CHA2DS2-VASc Score of 3.  Continue Apixaban (Eliquis).     Follow up in the AF clinic in 4-6 weeks.    Jorja Loa PA-C [facility] [address] [place], [place] [address] [phone] 04/05/2021 3:15 PM

## 2021-04-12 ENCOUNTER — Telehealth (HOSPITAL_COMMUNITY): Payer: Self-pay | Admitting: *Deleted

## 2021-04-12 MED ORDER — METOPROLOL SUCCINATE ER 25 MG PO TB24: 12.5000 mg | ORAL_TABLET | Freq: Every day | ORAL | 3 refills | Status: DC

## 2021-04-12 NOTE — Telephone Encounter (Signed)
Patient called in stating she is starting to have acid reflux and cp which is her symptom when she is not tolerating medication well. Pt has multiple medications listed in her allergy list due to upset stomach. Discussed with Jorja Loa PA will try decreasing metoprolol to 12.5mg  once a day. Pt has started prilosec which pt states helps her symptoms. Pt will let me know if her symptoms persist despite these changes.

## 2021-05-15 ENCOUNTER — Ambulatory Visit (HOSPITAL_COMMUNITY): Payer: Medicare Other

## 2021-05-15 ENCOUNTER — Ambulatory Visit (HOSPITAL_COMMUNITY): Payer: Medicare Other | Admitting: Physician Assistant

## 2021-05-31 DIAGNOSIS — H524 Presbyopia: Secondary | ICD-10-CM | POA: Diagnosis not present

## 2021-06-01 ENCOUNTER — Ambulatory Visit (HOSPITAL_COMMUNITY)
Admission: RE | Admit: 2021-06-01 | Discharge: 2021-06-01 | Disposition: A | Discharge status: Home / Self Care | Payer: Medicare Other | Source: Ambulatory Visit | Attending: Physician Assistant | Admitting: Physician Assistant

## 2021-06-01 ENCOUNTER — Ambulatory Visit (HOSPITAL_BASED_OUTPATIENT_CLINIC_OR_DEPARTMENT_OTHER)
Admission: RE | Admit: 2021-06-01 | Discharge: 2021-06-01 | Disposition: A | Discharge status: Home / Self Care | Payer: Medicare Other | Source: Ambulatory Visit | Attending: Physician Assistant | Admitting: Physician Assistant

## 2021-06-01 ENCOUNTER — Encounter (HOSPITAL_COMMUNITY): Payer: Self-pay | Admitting: Physician Assistant

## 2021-06-01 ENCOUNTER — Other Ambulatory Visit: Payer: Self-pay

## 2021-06-01 VITALS — BP 132/74 | HR 56 | Ht 63.5 in | Wt 117.8 lb

## 2021-06-01 DIAGNOSIS — E785 Hyperlipidemia, unspecified: Secondary | ICD-10-CM | POA: Insufficient documentation

## 2021-06-01 DIAGNOSIS — D6869 Other thrombophilia: Secondary | ICD-10-CM | POA: Diagnosis not present

## 2021-06-01 DIAGNOSIS — Z86718 Personal history of other venous thrombosis and embolism: Secondary | ICD-10-CM | POA: Diagnosis not present

## 2021-06-01 DIAGNOSIS — I480 Paroxysmal atrial fibrillation: Secondary | ICD-10-CM | POA: Diagnosis not present

## 2021-06-01 DIAGNOSIS — I082 Rheumatic disorders of both aortic and tricuspid valves: Secondary | ICD-10-CM | POA: Insufficient documentation

## 2021-06-01 HISTORY — PX: TRANSTHORACIC ECHOCARDIOGRAM: SHX275

## 2021-06-01 LAB — CBC
HCT: 44.1 % (ref 36.0–46.0)
Hemoglobin: 14.4 g/dL (ref 12.0–15.0)
MCH: 30.5 pg (ref 26.0–34.0)
MCHC: 32.7 g/dL (ref 30.0–36.0)
MCV: 93.4 fL (ref 80.0–100.0)
Platelets: 191 10*3/uL (ref 150–400)
RBC: 4.72 MIL/uL (ref 3.87–5.11)
RDW: 11.8 % (ref 11.5–15.5)
WBC: 5.7 10*3/uL (ref 4.0–10.5)
nRBC: 0 % (ref 0.0–0.2)

## 2021-06-01 LAB — ECHOCARDIOGRAM COMPLETE
Area-P 1/2: 2.12 cm2
Height: 63.5 in
P 1/2 time: 608 msec
S' Lateral: 2.9 cm
Single Plane A4C EF: 61 %
Weight: 1884.8 oz

## 2021-06-01 NOTE — Progress Notes (Signed)
Primary Care Physician: Juluis Rainier, MD Primary Cardiologist: none Primary Electrophysiologist: none Referring Physician: [facility]   Rachel Spencer is a 87 y.o. female with a history of DVT, HLD, and atrial fibrillation who presents for follow up in the [facility].  The patient was initially diagnosed with atrial fibrillation 03/29/21 following cataract surgery. She was in afib with RVR on arrival to the ED. She had symptoms of nausea and dizziness. She converted spontaneously in the ED. She was started on metoprolol and Eliquis for a CHADS2VASC score of 3.   On follow up today, patient reports that she has done well since her last visit. She denies any heart racing or palpitations. She denies any bleeding issues on anticoagulation.   Today, she denies symptoms of palpitations, chest pain, shortness of breath, orthopnea, PND, lower extremity edema, dizziness, presyncope, syncope, snoring, daytime somnolence, bleeding, or neurologic sequela. The patient is tolerating medications without difficulties and is otherwise without complaint today.    Atrial Fibrillation Risk Factors:  she does not have symptoms or diagnosis of sleep apnea. she does not have a history of rheumatic fever. she does not have a history of alcohol use. The patient does have a history of early familial atrial fibrillation or other arrhythmias. Daughter has afib.  she has a BMI of Body mass index is 20.54 kg/m.Marland Kitchen Filed Weights   06/01/21 1104  Weight: 53.4 kg     Family History  Problem Relation Age of Onset   Heart disease Mother    Hypertension Mother    Heart disease Father    Hyperlipidemia Father    Hypertension Father      Atrial Fibrillation Management history:  Previous antiarrhythmic drugs: none Previous cardioversions: none Previous ablations: none CHADS2VASC score: 3 Anticoagulation history: Eliquis   Past Medical History:  Diagnosis Date    Arthritis    DVT (deep venous thrombosis) (HCC)    Esophageal reflux    Hyperlipidemia    Varicose veins    Past Surgical History:  Procedure Laterality Date   APPENDECTOMY     skin grafting     on ulcers left ankle   VEIN LIGATION AND STRIPPING      Current Outpatient Medications  Medication Sig Dispense Refill   acetaminophen (TYLENOL) 325 MG tablet Take 650 mg by mouth every 6 (six) hours as needed for mild pain.     apixaban (ELIQUIS) 2.5 MG TABS tablet Take 1 tablet (2.5 mg total) by mouth 2 (two) times daily. 60 tablet 3   Chlorpheniramine Maleate (EQ CHLORTABS PO) Take 1 tablet by mouth daily. 4mg      Melatonin 5 MG CAPS Take 1 capsule by mouth at bedtime.     metoprolol succinate (TOPROL-XL) 25 MG 24 hr tablet Take 0.5 tablets (12.5 mg total) by mouth daily. 30 tablet 3   topiramate (TOPAMAX) 50 MG tablet Take 50 mg by mouth daily.     No current facility-administered medications for this encounter.    Allergies  Allergen Reactions   Tramadol Anaphylaxis    Tighten up throat   Codeine Nausea And Vomiting   Depakote [Divalproex Sodium] Other (See Comments)    hallucinations   Aspirin     Upset stomach   Brimonidine Tartrate     Caused fainting and dizziness    Cefadroxil     Doesn't remember reaction   Ibuprofen     Upset stomach   Rocklatan [Netarsudil-Latanoprost]     Caused fainting  and dizziness   Ropinirole    Fosamax [Alendronate Sodium] Nausea Only    Stomach upset   Vitamin D Analogs Nausea Only    Stomach upset    Social History   Socioeconomic History   Marital status: Widowed    Spouse name: Not on file   Number of children: Not on file   Years of education: Not on file   Highest education level: Not on file  Occupational History   Not on file  Tobacco Use   Smoking status: Never   Smokeless tobacco: Never  Substance and Sexual Activity   Alcohol use: No   Drug use: No   Sexual activity: Not on file  Other Topics Concern   Not on  file  Social History Narrative   Not on file   Social Determinants of Health   Financial Resource Strain: Not on file  Food Insecurity: Not on file  Transportation Needs: Not on file  Physical Activity: Not on file  Stress: Not on file  Social Connections: Not on file  Intimate Partner Violence: Not on file     ROS- All systems are reviewed and negative except as per the HPI above.  Physical Exam: Vitals:   06/01/21 1104  BP: 132/74  Pulse: (!) 56  Weight: 53.4 kg  Height: 5' 3.5" (1.613 m)    GEN- The patient is a well appearing elderly female, alert and oriented x 3 today.   HEENT-head normocephalic, atraumatic, sclera clear, conjunctiva pink, hearing intact, trachea midline. Lungs- Clear to ausculation bilaterally, normal work of breathing Heart- Regular rate and rhythm, no murmurs, rubs or gallops  GI- soft, NT, ND, + BS Extremities- no clubbing, cyanosis, or edema MS- no significant deformity or atrophy Skin- no rash or lesion Psych- euthymic mood, full affect Neuro- strength and sensation are intact   Wt Readings from Last 3 Encounters:  06/01/21 53.4 kg  04/05/21 54.2 kg  10/03/17 59.9 kg    EKG today demonstrates  SB Vent. rate 56 BPM PR interval 152 ms QRS duration 70 ms QT/QTcB 416/401 ms  Epic records are reviewed at length today  CHA2DS2-VASc Score = 3  The patient's score is based upon: CHF History: No HTN History: No Diabetes History: No Stroke History: No Vascular Disease History: No Age Score: 2 Gender Score: 1     ASSESSMENT AND PLAN: 1. Paroxysmal Atrial Fibrillation (ICD10:  I48.0) The patient's CHA2DS2-VASc score is 3, indicating a 3.2% annual risk of stroke.   Patient appears to be maintaining SR. Continue Eliquis 2.5 mg BID Continue Toprol 25 mg daily Echocardiogram pending later today.  2. Secondary Hypercoagulable State (ICD10:  D68.69) The patient is at significant risk for stroke/thromboembolism based upon her  CHA2DS2-VASc Score of 3.  Continue Apixaban (Eliquis).     Follow up in the AF clinic in 3 months.    Jorja Loa PA-C Af[facility] [address] [place], [place] [address] [phone] 06/01/2021 11:12 AM

## 2021-06-01 NOTE — Progress Notes (Signed)
  Echocardiogram 2D Echocardiogram has been performed.  Rachel Spencer 06/01/2021, 1:22 PM

## 2021-06-05 ENCOUNTER — Encounter (HOSPITAL_COMMUNITY): Payer: Self-pay | Admitting: *Deleted

## 2021-06-19 ENCOUNTER — Other Ambulatory Visit (HOSPITAL_COMMUNITY): Payer: Self-pay | Admitting: *Deleted

## 2021-06-19 MED ORDER — METOPROLOL SUCCINATE ER 25 MG PO TB24: 12.5000 mg | ORAL_TABLET | Freq: Every day | ORAL | 2 refills | Status: DC

## 2021-08-12 ENCOUNTER — Other Ambulatory Visit (HOSPITAL_COMMUNITY): Payer: Self-pay | Admitting: Physician Assistant

## 2021-09-05 DIAGNOSIS — H401121 Primary open-angle glaucoma, left eye, mild stage: Secondary | ICD-10-CM | POA: Diagnosis not present

## 2021-09-05 DIAGNOSIS — Z961 Presence of intraocular lens: Secondary | ICD-10-CM | POA: Diagnosis not present

## 2021-09-05 DIAGNOSIS — H04123 Dry eye syndrome of bilateral lacrimal glands: Secondary | ICD-10-CM | POA: Diagnosis not present

## 2021-09-05 DIAGNOSIS — H401413 Capsular glaucoma with pseudoexfoliation of lens, right eye, severe stage: Secondary | ICD-10-CM | POA: Diagnosis not present

## 2021-09-07 ENCOUNTER — Ambulatory Visit (HOSPITAL_COMMUNITY): Payer: Medicare Other | Admitting: Physician Assistant

## 2021-09-13 DIAGNOSIS — H903 Sensorineural hearing loss, bilateral: Secondary | ICD-10-CM | POA: Diagnosis not present

## 2021-09-18 DIAGNOSIS — Z1231 Encounter for screening mammogram for malignant neoplasm of breast: Secondary | ICD-10-CM | POA: Diagnosis not present

## 2021-09-26 ENCOUNTER — Other Ambulatory Visit: Payer: Self-pay

## 2021-09-26 ENCOUNTER — Ambulatory Visit (HOSPITAL_COMMUNITY)
Admission: RE | Admit: 2021-09-26 | Discharge: 2021-09-26 | Disposition: A | Discharge status: Home / Self Care | Payer: Medicare Other | Source: Ambulatory Visit | Attending: Physician Assistant | Admitting: Physician Assistant

## 2021-09-26 ENCOUNTER — Encounter (HOSPITAL_COMMUNITY): Payer: Self-pay | Admitting: Physician Assistant

## 2021-09-26 VITALS — BP 148/78 | HR 68 | Ht 63.5 in | Wt 120.6 lb

## 2021-09-26 DIAGNOSIS — D6869 Other thrombophilia: Secondary | ICD-10-CM | POA: Diagnosis not present

## 2021-09-26 DIAGNOSIS — Z86718 Personal history of other venous thrombosis and embolism: Secondary | ICD-10-CM | POA: Insufficient documentation

## 2021-09-26 DIAGNOSIS — Z7901 Long term (current) use of anticoagulants: Secondary | ICD-10-CM | POA: Insufficient documentation

## 2021-09-26 DIAGNOSIS — I480 Paroxysmal atrial fibrillation: Secondary | ICD-10-CM | POA: Diagnosis not present

## 2021-09-26 DIAGNOSIS — E785 Hyperlipidemia, unspecified: Secondary | ICD-10-CM | POA: Insufficient documentation

## 2021-09-26 NOTE — Progress Notes (Signed)
Primary Care Physician: Juluis Rainier, MD (Inactive) Primary Cardiologist: none Primary Electrophysiologist: none Referring Physician: [facility]   Rachel Spencer is a 87 y.o. female with a history of DVT, HLD, and atrial fibrillation who presents for follow up in the [facility].  The patient was initially diagnosed with atrial fibrillation 03/29/21 following cataract surgery. She was in afib with RVR on arrival to the ED. She had symptoms of nausea and dizziness. She converted spontaneously in the ED. She was started on metoprolol and Eliquis for a CHADS2VASC score of 3.   On follow up today, patient reports that she has done well since her last visit. She denies any heart racing or palpitations. No bleeding issues on anticoagulation.   Today, she denies symptoms of palpitations, chest pain, shortness of breath, orthopnea, PND, lower extremity edema, dizziness, presyncope, syncope, snoring, daytime somnolence, bleeding, or neurologic sequela. The patient is tolerating medications without difficulties and is otherwise without complaint today.    Atrial Fibrillation Risk Factors:  she does not have symptoms or diagnosis of sleep apnea. she does not have a history of rheumatic fever. she does not have a history of alcohol use. The patient does have a history of early familial atrial fibrillation or other arrhythmias. Daughter has afib.  she has a BMI of Body mass index is 21.03 kg/m.Marland Kitchen Filed Weights   09/26/21 1328  Weight: 54.7 kg      Family History  Problem Relation Age of Onset   Heart disease Mother    Hypertension Mother    Heart disease Father    Hyperlipidemia Father    Hypertension Father      Atrial Fibrillation Management history:  Previous antiarrhythmic drugs: none Previous cardioversions: none Previous ablations: none CHADS2VASC score: 3 Anticoagulation history: Eliquis   Past Medical History:  Diagnosis Date    Arthritis    DVT (deep venous thrombosis) (HCC)    Esophageal reflux    Hyperlipidemia    Varicose veins    Past Surgical History:  Procedure Laterality Date   APPENDECTOMY     skin grafting     on ulcers left ankle   VEIN LIGATION AND STRIPPING      Current Outpatient Medications  Medication Sig Dispense Refill   acetaminophen (TYLENOL) 325 MG tablet Take 650 mg by mouth every 6 (six) hours as needed for mild pain.     Chlorpheniramine Maleate (EQ CHLORTABS PO) Take 1 tablet by mouth daily. 4mg      ELIQUIS 2.5 MG TABS tablet TAKE ONE TABLET BY MOUTH TWICE DAILY 60 tablet 6   Melatonin 5 MG CAPS Take 1 capsule by mouth at bedtime.     metoprolol succinate (TOPROL-XL) 25 MG 24 hr tablet Take 0.5 tablets (12.5 mg total) by mouth daily. 45 tablet 2   No current facility-administered medications for this encounter.    Allergies  Allergen Reactions   Tramadol Anaphylaxis    Tighten up throat   Codeine Nausea And Vomiting   Depakote [Divalproex Sodium] Other (See Comments)    hallucinations   Aspirin     Upset stomach   Brimonidine Tartrate     Caused fainting and dizziness    Cefadroxil     Doesn't remember reaction   Ibuprofen     Upset stomach   Rocklatan [Netarsudil-Latanoprost]     Caused fainting and dizziness   Ropinirole    Fosamax [Alendronate Sodium] Nausea Only    Stomach upset   Vitamin  D Analogs Nausea Only    Stomach upset    Social History   Socioeconomic History   Marital status: Widowed    Spouse name: Not on file   Number of children: Not on file   Years of education: Not on file   Highest education level: Not on file  Occupational History   Not on file  Tobacco Use   Smoking status: Never   Smokeless tobacco: Never  Substance and Sexual Activity   Alcohol use: No   Drug use: No   Sexual activity: Not on file  Other Topics Concern   Not on file  Social History Narrative   Not on file   Social Determinants of Health   Financial  Resource Strain: Not on file  Food Insecurity: Not on file  Transportation Needs: Not on file  Physical Activity: Not on file  Stress: Not on file  Social Connections: Not on file  Intimate Partner Violence: Not on file     ROS- All systems are reviewed and negative except as per the HPI above.  Physical Exam: Vitals:   09/26/21 1328  BP: (!) 148/78  Pulse: 68  Weight: 54.7 kg  Height: 5' 3.5" (1.613 m)    GEN- The patient is a well appearing elderly female, alert and oriented x 3 today.   HEENT-head normocephalic, atraumatic, sclera clear, conjunctiva pink, hearing intact, trachea midline. Lungs- Clear to ausculation bilaterally, normal work of breathing Heart- Regular rate and rhythm, no murmurs, rubs or gallops  GI- soft, NT, ND, + BS Extremities- no clubbing, cyanosis, or edema MS- no significant deformity or atrophy Skin- no rash or lesion Psych- euthymic mood, full affect Neuro- strength and sensation are intact   Wt Readings from Last 3 Encounters:  09/26/21 54.7 kg  06/01/21 53.4 kg  04/05/21 54.2 kg    EKG today demonstrates  SR Vent. rate 68 BPM PR interval 148 ms QRS duration 70 ms QT/QTcB 382/406 ms  Echo 06/01/21 demonstrated  1. Left ventricular ejection fraction, by estimation, is 60 to 65%. The  left ventricle has normal function. The left ventricle has no regional  wall motion abnormalities. Left ventricular diastolic parameters are  consistent with Grade I diastolic dysfunction (impaired relaxation).   2. Right ventricular systolic function is normal. The right ventricular  size is normal. There is normal pulmonary artery systolic pressure. The estimated right ventricular systolic pressure is [id number] mmHg.   3. The mitral valve is normal in structure. Trivial mitral valve  regurgitation. No evidence of mitral stenosis.   4. Tricuspid valve regurgitation is moderate.   5. The aortic valve is tricuspid. Aortic valve regurgitation is mild.  Mild  aortic valve sclerosis is present, with no evidence of aortic valve  stenosis.   6. The inferior vena cava is dilated in size with >50% respiratory  variability, suggesting right atrial pressure of 8 mmHg.   Epic records are reviewed at length today  CHA2DS2-VASc Score = 3  The patient's score is based upon: CHF History: 0 HTN History: 0 Diabetes History: 0 Stroke History: 0 Vascular Disease History: 0 Age Score: 2 Gender Score: 1     ASSESSMENT AND PLAN: 1. Paroxysmal Atrial Fibrillation (ICD10:  I48.0) The patient's CHA2DS2-VASc score is 3, indicating a 3.2% annual risk of stroke.   Patient appears to be maintaining SR. Continue Eliquis 2.5 mg BID Patient has been taking Toprol 12.5 mg daily. She would like to try higher dose 25 mg since she has noted  systolic BP [id number] at home. If she has stomach issues again, she will go back down to 12.5 mg.  2. Secondary Hypercoagulable State (ICD10:  D68.69) The patient is at significant risk for stroke/thromboembolism based upon her CHA2DS2-VASc Score of 3.  Continue Apixaban (Eliquis).     Follow up in the AF clinic in 6 months.    [facility] [address] [place], [place] [address] [phone] 09/26/2021 1:37 PM

## 2021-10-30 DIAGNOSIS — R490 Dysphonia: Secondary | ICD-10-CM | POA: Diagnosis not present

## 2021-10-30 DIAGNOSIS — M8580 Other specified disorders of bone density and structure, unspecified site: Secondary | ICD-10-CM | POA: Diagnosis not present

## 2021-10-30 DIAGNOSIS — I480 Paroxysmal atrial fibrillation: Secondary | ICD-10-CM | POA: Diagnosis not present

## 2021-10-30 DIAGNOSIS — Z0000 Encounter for general adult medical examination without abnormal findings: Secondary | ICD-10-CM | POA: Diagnosis not present

## 2021-11-20 DIAGNOSIS — G43719 Chronic migraine without aura, intractable, without status migrainosus: Secondary | ICD-10-CM | POA: Diagnosis not present

## 2021-11-30 DIAGNOSIS — H401121 Primary open-angle glaucoma, left eye, mild stage: Secondary | ICD-10-CM | POA: Diagnosis not present

## 2021-11-30 DIAGNOSIS — H401413 Capsular glaucoma with pseudoexfoliation of lens, right eye, severe stage: Secondary | ICD-10-CM | POA: Diagnosis not present

## 2021-11-30 DIAGNOSIS — H04123 Dry eye syndrome of bilateral lacrimal glands: Secondary | ICD-10-CM | POA: Diagnosis not present

## 2021-11-30 DIAGNOSIS — Z961 Presence of intraocular lens: Secondary | ICD-10-CM | POA: Diagnosis not present

## 2021-12-12 DIAGNOSIS — G43719 Chronic migraine without aura, intractable, without status migrainosus: Secondary | ICD-10-CM | POA: Diagnosis not present

## 2021-12-17 ENCOUNTER — Ambulatory Visit (HOSPITAL_COMMUNITY): Payer: Medicare Other | Admitting: Physician Assistant

## 2021-12-28 ENCOUNTER — Ambulatory Visit (INDEPENDENT_AMBULATORY_CARE_PROVIDER_SITE_OTHER): Payer: Medicare Other | Admitting: Cardiology

## 2021-12-28 ENCOUNTER — Encounter: Payer: Self-pay | Admitting: Cardiology

## 2021-12-28 ENCOUNTER — Other Ambulatory Visit: Payer: Self-pay

## 2021-12-28 VITALS — BP 140/76 | HR 68 | Ht 63.0 in | Wt 119.6 lb

## 2021-12-28 DIAGNOSIS — R42 Dizziness and giddiness: Secondary | ICD-10-CM | POA: Diagnosis not present

## 2021-12-28 DIAGNOSIS — I480 Paroxysmal atrial fibrillation: Secondary | ICD-10-CM | POA: Diagnosis not present

## 2021-12-28 DIAGNOSIS — D6869 Other thrombophilia: Secondary | ICD-10-CM

## 2021-12-28 DIAGNOSIS — I4891 Unspecified atrial fibrillation: Secondary | ICD-10-CM | POA: Diagnosis not present

## 2021-12-28 NOTE — Progress Notes (Signed)
Primary Care Provider: Sigmund Hazel, MD [facility] Cardiologist: Bryan Lemma, MD Electrophysiologist: None; Clint R. Fenton, PA ([facility]fib [facility]) Neurologist: Dr. Neale Burly ([facility])  Clinic Note: Chief Complaint  Patient presents with   Dizziness    Associated with nausea and vomiting   Near Syncope   Atrial Fibrillation    Documented last year with PAF (postop from cataract surgery) started on this.    ===================================  ASSESSMENT/PLAN   Problem List Items Addressed This Visit       Cardiology Problems   Hypercoagulable state due to atrial fibrillation (HCC) (Chronic)    This patients CHA2DS2-VASc Score and unadjusted Ischemic Stroke Rate (% per year) is equal to 4.8 % stroke rate/year from a score of 4  Above score calculated as 1 point each if present [CHF, HTN, DM, Vascular=MI/PAD/Aortic Plaque, Age if 65-74, or Female] Above score calculated as 2 points each if present [Age > 75, or Stroke/TIA/TE]  ==> On reduced dose Eliquis 2.5 mg twice daily.  No bleeding issues.        Paroxysmal atrial fibrillation (HCC) - Primary (Chronic)    She has not had a documented breakthrough episode of A-fib since her cataract surgery and therefore we do not really know what her true symptoms are are that associate with A-fib.  Although the symptoms she had at the store did not necessarily correlate with classic A-fib symptoms, it is possible that she had the profound weakness and dizziness with being in A-fib RVR.  Thankfully, although she seemed to be quite disabled, she did not pass out and did not have chest pain or dyspnea.  Unfortunately) we cannot know how often the spells will occur, making an event monitor less helpful.  Plan:  Recommend that she purchase a home mobile heart monitor program (Kardia mobile) that she can apply when these episodes occur.  This way we can determine if she is truly in A-fib. For now continue current dose of  beta-blocker along with reduced dose Eliquis. Maintaining adequate hydration to avoid dehydration related fatigue and dizziness. If she does have breakthrough spell and is very symptomatic, I recommend that she does not "fulgurate out "and that she actually contacted EMS, because they can check an EKG and possibly get her to [facility], ER where she can be Cardioverted.      Relevant Orders   EKG 12-Lead     Other   Dizziness, nonspecific    Really hard to know what these episodes truly were.  If she was profoundly dizzy but much more than just dizzy she was a week, lethargic and nauseated.  Very atypical conglomeration of symptoms that we could simply call cardiac in nature unless this is how she feels when she goes in A-fib.  Would like to be able to clarify if she is truly in an arrhythmia during these episodes.  Unfortunately, we do not know how frequently they will occur and therefore short-term monitor would probably not be helpful.  Would hesitate to place loop recorder.  Best option would be a portable monitor -- KARDIA or EMAY.    I also stressed the importance of staying adequately hydrated.  Would allow for permissive hypertension.  I do not want to increase her beta-blocker dose because I do not want the fear of bradycardia.  Also I agree with treating nausea.       ===================================  HPI:    Rachel Spencer is a 88 y.o. female with history of DVT,  HLD, and neurology new diagnosis of atrial fibrillation back in June 2022 in the setting of cardiac surgery who is being seen today to establish primary cardiology care.  She is being seen today at the request of Santiago Glad, MD.  Recent Hospitalizations:  Diagnosed with A-fib RVR on 03/29/2021 in setting of cataract surgery.  Was in A-fib upon arrival to the ER.  Had nausea and dizziness.  Spontaneously converted in the ER started metoprolol along with Eliquis (CHA2DS2-VASc score 3.)  Seen in the A-fib clinic  by Mr. Fenton, PA on 04/05/2021.  Relatively asymptomatic from a cardiovascular risk  Rachel Spencer was last seen on June 01, 2021 by Mr. Fenton, PA in the A-fib clinic.  Doing well.  No recurrent palpitations.  No bleeding issues on anticoagulation.  CHA2DS2-VASc score was 3.  She then was seen in November also doing well.  Some dizziness but nothing significant.   Reviewed  CV studies:    The following studies were reviewed today: (if available, images/films reviewed: From Epic Chart or Care Everywhere) Echocardiogram 06/01/2021: EF 60 to 65%.  GR 1 DD.  Aortic valve sclerosis but no stenosis.  Moderate TR but otherwise normal studies.  Interval History:   Rachel Spencer presents here today along with her daughter.  She is a very difficult historian for some time to fully understand what was happening. We went back to discuss her initial diagnosis of atrial fibrillation and the symptoms she felt.  Unfortunately, this was in the postop setting so she was lying in bed, but she recalls those that she felt very nauseated and just totally uncomfortable.  She was asking for something to drink and something for nausea and was told that she was not able to be given anything postoperatively.  She eventually was found to be in atrial fibrillation and sent to the hospital emergency room.  In the emergency room she felt better after IV fluids and antiemetics.  She does not recall having any sensation of irregular heartbeats or palpitations.  The main symptom she had was feeling very tired, fatigued and extremely nauseated.  Since that episode, she had been doing fairly well.  She was tolerating the Eliquis and beta-blocker although she was not sure what the Eliquis was for.  She thought that it was treating the atrial fibrillation, and that the metoprolol was keeping her out of it.  She was in her usual state of health and doing relatively well until a couple of weeks ago.  She has had 2 episodes that were very  concerning to her:  Most prominent episode occurred just before she saw Dr. Neale Burly.  She was out doing errands, and decided to go to the grocery store to get a couple of items before going home.  She said that she had been feeling fine that day with no major issues, but when she to the grocery store she just felt horrible where she was so weak that she could barely walk.  She felt profoundly fatigued nauseated and dizzy.  She felt lightheaded and woozy.  She struggled to make it through the store to get the final and that she wanted to get leaning on a cart.  She was able to make it back out of the car and collectors cannot decide to drive the short distance back home.  She safely made at home, but was barely able to get up the steps to her house.  When she got there she basically threw the groceries  on the counter and made it to the bathroom where she proceeded to dry heave and vomit.  She then collect herself up, got a drink of water and went to the recliner.  When she finally went to the recliner she drank some water and woke up few hours later feeling back to normal again.-All told this was at least an hour to 2 hours of symptoms.  She unfortunately is not aware of how long the episode really lasted. The other episode actually occurred about a week before this where she again was at a store and started feeling the weakness and foggy headedness but not as much the nausea.  This will did not last as long.  Maybe about 5 to 10 minutes.  She was not completely debilitated  Thankfully, she did not have any symptoms of chest pain or dyspnea with these episodes.  She has not had any further spells since but she is extremely anxious about this episode.  While she did not actually lose consciousness or fall, she was very concerned about falling.  This is brought about discussion between she and her daughters about the safety of her continue to live alone versus moving in with one of the daughters.  Besides these  2 episodes, she has been doing okay and has not had any issues whatsoever with the beta-blocker or her Eliquis.  No bleeding issues, melena, hematochezia or hematuria.  She has not felt any irregular heartbeats or palpitations nor is she felt any PND, orthopnea or edema.  No loss of consciousness.  REVIEWED OF SYSTEMS   Pertinent symptoms noted above.  Other noncardiac symptoms include Review of Systems  Constitutional:  Positive for malaise/fatigue (Only associated with the 2 episodes that she described.  She did feel little bit tired when she woke up after the major episode but not otherwise.). Negative for weight loss.  HENT:  Negative for congestion and nosebleeds.   Respiratory:  Negative for cough and shortness of breath.   Cardiovascular:        Per HPI  Gastrointestinal:  Negative for blood in stool, constipation and melena.       Episodes included symptoms of nausea with dry heaves emesis, but not otherwise present.  Genitourinary:  Negative for dysuria and hematuria.  Musculoskeletal:  Positive for joint pain. Negative for back pain, falls (She never fell) and myalgias.  Neurological:  Positive for dizziness (Per HPI) and weakness (She felt profoundly weak during that prolonged episode.  Could barely walk.). Negative for focal weakness.  Psychiatric/Behavioral:  Positive for memory loss. Negative for depression. The patient is nervous/anxious (Extremely anxious about these most recent episodes.). The patient does not have insomnia.    I have reviewed and (if needed) personally updated the patient's problem list, medications, allergies, past medical and surgical history, social and family history.   PAST MEDICAL HISTORY   Past Medical History:  Diagnosis Date   Arthritis    DVT (deep venous thrombosis) (HCC)    Esophageal reflux    Hypercoagulable state due to atrial fibrillation (HCC) 04/05/2021   Hyperlipidemia    Varicose veins     PAST SURGICAL HISTORY   Past Surgical  History:  Procedure Laterality Date   APPENDECTOMY     skin grafting     on ulcers left ankle   VEIN LIGATION AND STRIPPING      Immunization History  Administered Date(s) Administered   Influenza Split 09/13/2014   Influenza, High Dose Seasonal PF 07/27/2015, 08/27/2016, 08/05/2019  Influenza,inj,Quad PF,6+ Mos 09/02/2017, 08/24/2018   Influenza,inj,quad, With Preservative 07/28/2017   Influenza-Unspecified 08/03/2012   PFIZER(Purple Top)SARS-COV-2 Vaccination 11/16/2019, 12/07/2019   Pneumococcal Conjugate-13 05/18/2014   Pneumococcal Polysaccharide-23 07/21/2001   Td 08/12/2002   Tdap 11/12/2011   Zoster, Live 01/04/2008    MEDICATIONS/ALLERGIES   Current Meds  Medication Sig   acetaminophen (TYLENOL) 325 MG tablet Take 650 mg by mouth every 6 (six) hours as needed for mild pain.   Chlorpheniramine Maleate (EQ CHLORTABS PO) Take 1 tablet by mouth daily. 4mg    ELIQUIS 2.5 MG TABS tablet TAKE ONE TABLET BY MOUTH TWICE DAILY   Melatonin 5 MG CAPS Take 1 capsule by mouth at bedtime.   metoCLOPramide (REGLAN) 10 MG tablet Take 10 mg by mouth 2 (two) times daily as needed.   topiramate (TOPAMAX) 50 MG tablet Take 50 mg by mouth 2 (two) times daily.    Allergies  Allergen Reactions   Tramadol Anaphylaxis    Tighten up throat   Codeine Nausea And Vomiting   Depakote [Divalproex Sodium] Other (See Comments)    hallucinations   Aspirin     Upset stomach   Brimonidine Tartrate     Caused fainting and dizziness    Cefadroxil     Doesn't remember reaction   Ibuprofen     Upset stomach   Rocklatan [Netarsudil-Latanoprost]     Caused fainting and dizziness   Ropinirole    Alendronate Sodium Nausea Only and Other (See Comments)    Stomach upset   Vitamin D Analogs Nausea Only    Stomach upset    SOCIAL HISTORY/FAMILY HISTORY   Reviewed in Epic:   Social History   Tobacco Use   Smoking status: Never   Smokeless tobacco: Never  Substance Use Topics   Alcohol  use: No   Drug use: No   Social History   Social History Narrative   Not on file   Family History  Problem Relation Age of Onset   Heart disease Mother    Hypertension Mother    Heart disease Father    Hyperlipidemia Father    Hypertension Father     OBJCTIVE -PE, EKG, labs   Wt Readings from Last 3 Encounters:  12/28/21 119 lb 9.6 oz (54.3 kg)  09/26/21 120 lb 9.6 oz (54.7 kg)  06/01/21 117 lb 12.8 oz (53.4 kg)    Physical Exam: BP 140/76 (BP Location: Left Arm, Patient Position: Sitting)   Pulse 68   Ht 5\' 3"  (1.6 m)   Wt 119 lb 9.6 oz (54.3 kg)   SpO2 97%   BMI 21.19 kg/m  Physical Exam Vitals reviewed.  Constitutional:      General: She is not in acute distress.    Appearance: Normal appearance. She is normal weight. She is not ill-appearing or toxic-appearing.  HENT:     Head: Normocephalic and atraumatic.     Ears:     Comments: Very hard of hearing despite having hearing aids Eyes:     Extraocular Movements: Extraocular movements intact.     Pupils: Pupils are equal, round, and reactive to light.  Neck:     Vascular: No hepatojugular reflux or JVD.  Cardiovascular:     Rate and Rhythm: Normal rate and regular rhythm. Occasional Extrasystoles are present.    Pulses: Normal pulses.     Heart sounds: S1 normal and S2 normal. Murmur (1/6 SEM at RUSB.) heard.    No friction rub. No gallop.  Pulmonary:  Effort: Pulmonary effort is normal. No respiratory distress.     Breath sounds: No wheezing or rales.  Chest:     Chest wall: No tenderness.  Abdominal:     General: Abdomen is flat. Bowel sounds are normal. There is no distension.     Palpations: Abdomen is soft. There is no mass (No HSM or bruit).     Tenderness: There is no abdominal tenderness.  Musculoskeletal:     Cervical back: Normal range of motion and neck supple.  Neurological:     Mental Status: She is alert.     Adult ECG Report  Rate: 68;  Rhythm: normal sinus rhythm;   Narrative  Interpretation: Normal axis, intervals, durations.  Recent Labs: Reviewed Most recent lipid panel from December 2021: TC 227, TG 79, HDL 85, LDL 129. No results found for: CHOL, HDL, LDLCALC, LDLDIRECT, TRIG, CHOLHDL Lab Results  Component Value Date   CREATININE 0.66 03/29/2021   BUN 20 03/29/2021   NA 138 03/29/2021   K 3.7 03/29/2021   CL 105 03/29/2021   CO2 28 03/29/2021   CBC Latest Ref Rng & Units 06/01/2021 03/29/2021 09/09/2008  WBC 4.0 - 10.5 K/uL 5.7 4.8 7.3  Hemoglobin 12.0 - 15.0 g/dL 30.8 65.7 84.6 REPEATED TO VERIFY(L)  Hematocrit 36.0 - 46.0 % 44.1 45.8 34.2(L)  Platelets 150 - 400 K/uL 191 190 223 DELTA CHECK NOTED    No results found for: HGBA1C Lab Results  Component Value Date   TSH 1.323 03/29/2021    ==================================================  COVID-19 Education: The signs and symptoms of COVID-19 were discussed with the patient and how to seek care for testing (follow up with PCP or arrange E-visit).    I spent a total of 61 minutes with the patient spent in direct patient consultation.  => Long time was spent just understanding her symptoms and listening to her tell the story.  We then went into explanation about the pathophysiology of atrial fibrillation, the mechanism of treatment with Eliquis versus the beta-blocker etc.  We then talked about monitoring. Additional time spent with chart review  / charting (studies, outside notes, etc): 32 min => echo images reviewed and report read, clinic notes from A-fib clinic and neurologist as well as hospital report reviewed. Total Time: 93 min  Current medicines are reviewed at length with the patient today.  (+/- concerns) n/a  This visit occurred during the SARS-CoV-2 public health emergency.  Safety protocols were in place, including screening questions prior to the visit, additional usage of staff PPE, and extensive cleaning of exam room while observing appropriate contact time as indicated for  disinfecting solutions.  Notice: This dictation was prepared with Dragon dictation along with smart phrase technology. Any transcriptional errors that result from this process are unintentional and may not be corrected upon review.   Studies Ordered:  Orders Placed This Encounter  Procedures   EKG 12-Lead    Patient Instructions / Medication Changes & Studies & Tests Ordered   Patient Instructions  Medication Instructions:  No changes    If you think  your afib  and symptomatic - you can take an extra Metoprolol   (RightThen) that day  *If you need a refill on your cardiac medications before your next appointment, please call your pharmacy*   Lab Work: Not needed    Testing/Procedures: Not needed   Follow-Up: At [facility], you and your health needs are our priority.  As part of our continuing mission to provide you  with exceptional heart care, we have created designated Provider Care Teams.  These Care Teams include your primary Cardiologist (physician) and Advanced Practice Providers (APPs -  Physician Assistants and Nurse Practitioners) who all work together to provide you with the care you need, when you need it.  We recommend signing up for the patient portal called "MyChart".  Sign up information is provided on this After Visit Summary.  MyChart is used to connect with patients for Virtual Visits (Telemedicine).  Patients are able to view lab/test results, encounter notes, upcoming appointments, etc.  Non-urgent messages can be sent to your provider as well.   To learn more about what you can do with MyChart, go to [email or web address].    Your next appointment:   6 week(s)  The format for your next appointment:   In Person  Provider:        Other Instructions  Okay to purchase a Medic Alert bracelet   Recommend your purchasing We recommend that you order a Kardia monitor for home use when the palpitations occur. You can attach your kardia monitor EKG  to your MyChart account and send to [place] for review        DR Milagros Middendorf RECOMMENDS THAT YOU  PURCHASES  " Kardia" By [employer]. FROM THE  GOOGLE/ITUNE  APP PLAY STORE.   THE APP IS FREE , BUT THE  EQUIPMENT HAS A COST. IT IS A WAY FOR YOU TO OBTAIN A RECORDING OF YOUR HEART RATE . IT WILL BE A SHORT RHYTHM  STRIP THAT YOU CAN SHOW TO MEDICAL STAFF.  ALSO , YOU MAY WANT RESEARCH [employer] - EMAY - EKG- MONITOR YOU ARE ABLE TO KEEP RECORDINGS ON YOUR SMARTPHONE , INSTEAD OF PURCHASING A SEPARATE EQUIPMENT   Dr Herbie Baltimore  recommends portable  ECG monitor  to capture heart rate reading. There is a monitor that can be purchased on [employer]. The  monitor name is "EMAY"  at last checked it cost $79.  Our understanding there no more cost of that purchasing the machine. You will need a computer to be able to upload your recording and to be printed. It also come with a  USB to charge the monitor.     Bryan Lemma, M.D., M.S. Interventional Cardiologist   Pager # [phone] Phone # [phone] [address] [place], [place] [address]   Thank you for choosing Heartcare at [facility]!!

## 2021-12-28 NOTE — Patient Instructions (Addendum)
Medication Instructions:  ?No changes ? ? ? If you think  your afib  and symptomatic - you can take an extra Metoprolol   (RightThen) that day  ?*If you need a refill on your cardiac medications before your next appointment, please call your pharmacy* ? ? ?Lab Work: ?Not needed ? ? ? ?Testing/Procedures: ?Not needed ? ? ?Follow-Up: ?At [facility], you and your health needs are our priority.  As part of our continuing mission to provide you with exceptional heart care, we have created designated Provider Care Teams.  These Care Teams include your primary Cardiologist (physician) and Advanced Practice Providers (APPs -  Physician Assistants and Nurse Practitioners) who all work together to provide you with the care you need, when you need it. ? ?We recommend signing up for the patient portal called "MyChart".  Sign up information is provided on this After Visit Summary.  MyChart is used to connect with patients for Virtual Visits (Telemedicine).  Patients are able to view lab/test results, encounter notes, upcoming appointments, etc.  Non-urgent messages can be sent to your provider as well.   ?To learn more about what you can do with MyChart, go to [email or web address].   ? ?Your next appointment:   ?6 week(s) ? ?The format for your next appointment:   ?In Person ? ?Provider:   ?   ? ? ?Other Instructions ? Okay to purchase a Medic Alert bracelet ? ? ?Recommend your purchasing ?We recommend that you order a Kardia monitor for home use when the palpitations occur. You can attach your kardia monitor EKG to your MyChart account and send to [place] for review    ? ?   DR Ellyn Hack RECOMMENDS THAT YOU  PURCHASES  " Kardia" By [employer]. FROM THE  GOOGLE/ITUNE  APP PLAY STORE.  ? THE APP IS FREE , BUT THE  EQUIPMENT HAS A COST. ?IT IS A WAY FOR YOU TO OBTAIN A RECORDING OF YOUR HEART RATE . IT WILL BE A SHORT RHYTHM  STRIP THAT YOU CAN SHOW TO MEDICAL STAFF. ? ?ALSO , YOU MAY [place] - EKG-  MONITOR ?YOU ARE ABLE TO KEEP RECORDINGS ON YOUR SMARTPHONE , INSTEAD OF PURCHASING A SEPARATE EQUIPMENT ? ? Dr Ellyn Hack  recommends portable  ECG monitor  to capture heart rate reading. ?There is a monitor that can be purchased on [employer]. The  monitor name is "EMAY"  ?at last checked it cost $79.  Our understanding there no more cost of that purchasing the machine. ?You will need a computer to be able to upload your recording and to be printed. It also come with a  ?USB to charge the monitor.  ?

## 2021-12-29 ENCOUNTER — Encounter: Payer: Self-pay | Admitting: Cardiology

## 2021-12-29 DIAGNOSIS — R42 Dizziness and giddiness: Secondary | ICD-10-CM | POA: Insufficient documentation

## 2021-12-29 NOTE — Assessment & Plan Note (Signed)
This patients CHA2DS2-VASc Score and unadjusted Ischemic Stroke Rate (% per year) is equal to 4.8 % stroke rate/year from a score of 4 ? ?Above score calculated as 1 point each if present [CHF, HTN, DM, Vascular=MI/PAD/Aortic Plaque, Age if 101-74, or Female] ?Above score calculated as 2 points each if present [Age > 75, or Stroke/TIA/TE] ? ?==> On reduced dose Eliquis 2.5 mg twice daily.  No bleeding issues. ? ? ?

## 2021-12-29 NOTE — Assessment & Plan Note (Signed)
Really hard to know what these episodes truly were.  If she was profoundly dizzy but much more than just dizzy she was a week, lethargic and nauseated.  Very atypical conglomeration of symptoms that we could simply call cardiac in nature unless this is how she feels when she goes in A-fib. ? ?Would like to be able to clarify if she is truly in an arrhythmia during these episodes.  Unfortunately, we do not know how frequently they will occur and therefore short-term monitor would probably not be helpful.  Would hesitate to place loop recorder.  Best option would be a portable monitor -- KARDIA or EMAY.   ? ?I also stressed the importance of staying adequately hydrated.  Would allow for permissive hypertension.  I do not want to increase her beta-blocker dose because I do not want the fear of bradycardia. ? ?Also I agree with treating nausea. ?

## 2021-12-29 NOTE — Assessment & Plan Note (Signed)
She has not had a documented breakthrough episode of A-fib since her cataract surgery and therefore we do not really know what her true symptoms are are that associate with A-fib. ? ?Although the symptoms she had at the store did not necessarily correlate with classic A-fib symptoms, it is possible that she had the profound weakness and dizziness with being in A-fib RVR.  Thankfully, although she seemed to be quite disabled, she did not pass out and did not have chest pain or dyspnea. ? ?Unfortunately) we cannot know how often the spells will occur, making an event monitor less helpful. ? ?Plan:  ?? Recommend that she purchase a home mobile heart monitor program (Kardia mobile) that she can apply when these episodes occur.  This way we can determine if she is truly in A-fib. ?? For now continue current dose of beta-blocker along with reduced dose Eliquis. ?? Maintaining adequate hydration to avoid dehydration related fatigue and dizziness. ?? If she does have breakthrough spell and is very symptomatic, I recommend that she does not "fulgurate out "and that she actually contacted EMS, because they can check an EKG and possibly get her to [facility], ER where she can be Cardioverted. ?

## 2022-01-23 DIAGNOSIS — H04123 Dry eye syndrome of bilateral lacrimal glands: Secondary | ICD-10-CM | POA: Diagnosis not present

## 2022-01-23 DIAGNOSIS — Z961 Presence of intraocular lens: Secondary | ICD-10-CM | POA: Diagnosis not present

## 2022-01-23 DIAGNOSIS — H401121 Primary open-angle glaucoma, left eye, mild stage: Secondary | ICD-10-CM | POA: Diagnosis not present

## 2022-01-23 DIAGNOSIS — H401413 Capsular glaucoma with pseudoexfoliation of lens, right eye, severe stage: Secondary | ICD-10-CM | POA: Diagnosis not present

## 2022-02-06 DIAGNOSIS — L821 Other seborrheic keratosis: Secondary | ICD-10-CM | POA: Diagnosis not present

## 2022-02-06 DIAGNOSIS — D485 Neoplasm of uncertain behavior of skin: Secondary | ICD-10-CM | POA: Diagnosis not present

## 2022-02-06 DIAGNOSIS — L820 Inflamed seborrheic keratosis: Secondary | ICD-10-CM | POA: Diagnosis not present

## 2022-02-06 DIAGNOSIS — Z85828 Personal history of other malignant neoplasm of skin: Secondary | ICD-10-CM | POA: Diagnosis not present

## 2022-02-06 DIAGNOSIS — D0439 Carcinoma in situ of skin of other parts of face: Secondary | ICD-10-CM | POA: Diagnosis not present

## 2022-02-12 ENCOUNTER — Ambulatory Visit (INDEPENDENT_AMBULATORY_CARE_PROVIDER_SITE_OTHER): Payer: Medicare Other | Admitting: Cardiology

## 2022-02-12 ENCOUNTER — Encounter: Payer: Self-pay | Admitting: Cardiology

## 2022-02-12 VITALS — BP 138/70 | HR 70 | Ht 63.0 in | Wt 119.0 lb

## 2022-02-12 DIAGNOSIS — I824Z9 Acute embolism and thrombosis of unspecified deep veins of unspecified distal lower extremity: Secondary | ICD-10-CM

## 2022-02-12 DIAGNOSIS — D6869 Other thrombophilia: Secondary | ICD-10-CM | POA: Diagnosis not present

## 2022-02-12 DIAGNOSIS — R42 Dizziness and giddiness: Secondary | ICD-10-CM

## 2022-02-12 DIAGNOSIS — I480 Paroxysmal atrial fibrillation: Secondary | ICD-10-CM

## 2022-02-12 DIAGNOSIS — I83893 Varicose veins of bilateral lower extremities with other complications: Secondary | ICD-10-CM

## 2022-02-12 NOTE — Patient Instructions (Addendum)
Medication Instructions: No changes ? If you think  you are in  afib  and symptomatic - you can take an extra Metoprolol  (RightThen) that day and if continues after 4 to 6 hour contact office  ? ?*If you need a refill on your cardiac medications before your next appointment, please call your pharmacy* ? ? ?Lab Work: ?Not needed ? ? ? ? ?Testing/Procedures: ?Not needed ? ? ?Follow-Up: ?At [facility], you and your health needs are our priority.  As part of our continuing mission to provide you with exceptional heart care, we have created designated Provider Care Teams.  These Care Teams include your primary Cardiologist (physician) and Advanced Practice Providers (APPs -  Physician Assistants and Nurse Practitioners) who all work together to provide you with the care you need, when you need it. ? ?  ? ?Your next appointment:   ?7 to 8 month(s) Nov or Dec 2023 ? ?The format for your next appointment:   ?In Person ? ?Provider:   ?Bryan Lemma, MD  ? ? ?

## 2022-02-12 NOTE — Progress Notes (Signed)
Primary Care Provider: Sigmund Hazel, MD Cardiologist: Rachel Lemma, MD Electrophysiologist: None Rachel R. Charlean Merl, PA ([facility]) Neurologist: Rachel Spencer ([facility])  Clinic Note: Chief Complaint  Patient presents with   Follow-up    1 month   Atrial Fibrillation    Stable.  No changes    ===================================  ASSESSMENT/PLAN   Problem List Items Addressed This Visit       Cardiology Problems   Varicose veins of bilateral lower extremities with other complications (Chronic)    Continue to recommend foot elevation, and possible support stockings.  Would try to avoid diuretic.       DVT (deep venous thrombosis) (HCC) (Chronic)    History of DVT.  No longer an issue now that she is on Eliquis.  She may have some mild postphlebitic symptoms in her legs with varicose veins.  Recommend support stockings and foot elevation.       Paroxysmal atrial fibrillation (HCC) - Primary (Chronic)    Probably had 1 prolonged and 1 short episode of A-fib based on symptoms since her cataract surgery.  Unfortunately, we cannot ensure that it was or was not indeed A-fib.  They have purchased the [facility] APP/Sensor --> she knows to try to use this if she feels bad.  She also notes that she is not feeling well that she should probably contact police or family if not the office less EMS.  I would not want her driving when she is feeling so poorly.  If she is indeed in A-fib, and she has not missed a dose of Eliquis, they could then potentially cardiovert her in the emergency room and she can go home.  Also discussed using a extra dose of Toprol if she feels her going into A-fib as this may break it.  Continue Eliquis 2.5 mg twice daily        Relevant Orders   EKG 12-Lead (Completed)     Other   Hypercoagulable state due to atrial fibrillation (HCC)-CHA2DS2-VASc score 4 (HTN, female, age greater than 35); most likely score is 5 with vascular  disease. (Chronic)    With increased stroke risk, and uncertainty about breakthrough episodes, she is on low-dose Eliquis 2.5 mg twice daily for stroke prevention.  No bleeding issues.  Thankfully, no significant falls.  Okay to hold Eliquis 2 to 3 days preop for surgeries or procedures, and may need to hold as much is 2 days postop       Dizziness, nonspecific (Chronic)    She had that one prolonged episode and then another shorter episode that were quite likely associated with her being in A-fib.  Stressed importance of adequate hydration.  Allowing for permissive hypertension we will not titrate her beta-blocker any further.  Only recommended that if she feels was going to A-fib or noticed on cardia mobile but she can take an extra half tablet of Toprol.        ===================================  HPI:    Rachel Spencer is a 88 y.o. female with a PMH notable for A-fib (diagnosed June 2022 -> following cataract surgery)) with longstanding history of DVT/Varicose Veins-Vein Stripping, HLD, and chronic headaches who presents today for 6-week follow-up.  Diagnosed with A-fib RVR on 03/29/2021 in setting of cataract surgery.  Was in A-fib upon arrival to the ER.  Had nausea and dizziness.   Spontaneously converted in the ER started metoprolol along with Eliquis (CHA2DS2-VASc score 3.)  Rachel Spencer was seen on December 28, 2021 To Establish "Primary Cardiology Care "after initially being monitored and treated by the A-fib clinic.  At that point, we had not had any evidence of a documented breakthrough spell of A-fib since cataract surgery..  She herself was not sure what the symptoms meant.  She is a very difficult historian with poor recollection/memory loss.  She recalls feeling nauseated and uncomfortable and asking for some to drink and something for nausea while in the PACU. She was found of the A-fib and sent to the ER.  She felt better after IV fluids and antiemetics.  She did not have any  sense of irregular heartbeats or palpitations.  Just fatigued tired and nauseated. => She reported an episode just prior to seeing Rachel Spencer when she was out doing errands.  She went to the grocery store to get a couple items, and by the time she made it into the grocery store she felt horrible to the point where she could barely walk.  She felt profoundly fatigued nauseated and dizzy.  Lightheaded but no syncope.  She struggled to make it through to the checkout counter leaning on the cart.  She was able to collect herself sitting in the car before driving home.  She did not realize it was very difficult for him to get into the house making up steps.  As soon as she got in the house, she had dry heaves and vomiting.  She finally got some of the drink and went to lay down the recliner for a nap.  When she woke up a couple hours later she felt better.  All told it lasted maybe 2 hours.  She had a second episode that was similar to that a week earlier but did not last as long and not as severe.  Only lasted maybe 5 to 10 minutes. => My impression was that these spells probably indicated her being in A-fib.  I recommended that they purchase the [facility] device in order to check out her rhythm when she has one of the spells.  Also stressed the importance of adequate hydration and allowing for permissive hypertension. => I also recommended that she not try to "soldier through these episodes and maybe go to the ER via EMS if she cannot drive.    Recent Hospitalizations: None  Reviewed  CV studies:    The following studies were reviewed today: (if available, images/films reviewed: From Epic Chart or Care Everywhere) None:  Interval History:   Rachel Spencer returns for close follow-up.  They did purchase the [place] device.  Thankfully, she has not had any more of the episodes to check.  It does confirm that she is currently in sinus rhythm.  She was able to look back and discussed the episodes that we  talked about the last visit.  She thinks that this may have been associated with her being somewhat stressed out.  Her blood pressure seems to be little high today usually little bit lower than this at home.  She overall feels pretty well.  No further episodes.  They are happy that they actually have a way of evaluating her symptoms and again plan of what to do.  She is trying to her best to make sure she keeps up her hydration.  CV Review of Symptoms (Summary) Cardiovascular ROS: no chest pain or dyspnea on exertion negative for - dyspnea on exertion, edema, irregular heartbeat, orthopnea, palpitations, paroxysmal nocturnal dyspnea, rapid heart rate, shortness of breath, or no further  syncope or near syncope symptoms.  Notably profound nausea and fatigue symptoms.  No TIA or amaurosis fugax symptoms.  REVIEWED OF SYSTEMS   Review of Systems  Constitutional:  Negative for malaise/fatigue and weight loss.  HENT:  Negative for nosebleeds.   Respiratory:  Negative for cough, hemoptysis and shortness of breath.   Cardiovascular:  Positive for leg swelling (Mild).       Per HPI  Gastrointestinal:  Negative for blood in stool and melena.  Genitourinary:  Negative for hematuria.  Musculoskeletal:  Positive for joint pain (Right shoulder). Negative for falls and myalgias.  Neurological:  Positive for dizziness (Some orthostatic dizziness) and weakness (Just generalized). Negative for focal weakness.  Psychiatric/Behavioral:  Positive for memory loss. Negative for depression. The patient is nervous/anxious (She can get anxious very easily when things do not seem right.). The patient does not have insomnia.    I have reviewed and (if needed) personally updated the patient's problem list, medications, allergies, past medical and surgical history, social and family history.   PAST MEDICAL HISTORY   Past Medical History:  Diagnosis Date   Arthritis    DVT (deep venous thrombosis) (HCC)    Esophageal  reflux    Hypercoagulable state due to atrial fibrillation ([place]) 04/05/2021   Hyperlipidemia    Paroxysmal atrial fibrillation ([place]) 03/2021   Diagnosed with A-fib RVR on 03/29/2021 in setting of cataract surgery.  Was in A-fib upon arrival to the ER.  Had nausea and dizziness. => Spontaneously cardioverted in ER.  CHA2DS2-VASc score 3.  Rate control with metoprolol, DOAC-Eliquis 2.5 mg twice daily   Varicose veins    History of vein stripping    PAST SURGICAL HISTORY   Past Surgical History:  Procedure Laterality Date   APPENDECTOMY     skin grafting     on ulcers left ankle   TRANSTHORACIC ECHOCARDIOGRAM  06/01/2021   EF 60 to 65%.  GR 1 DD.  Aortic valve sclerosis but no stenosis.  Moderate TR but otherwise normal studies.   VEIN LIGATION AND STRIPPING      Immunization History  Administered Date(s) Administered   Influenza Split 09/13/2014   Influenza, High Dose Seasonal PF 07/27/2015, 08/27/2016, 08/05/2019   Influenza,inj,Quad PF,6+ Mos 09/02/2017, 08/24/2018   Influenza,inj,quad, With Preservative 07/28/2017   Influenza-Unspecified 08/03/2012   PFIZER(Purple Top)SARS-COV-2 Vaccination 11/16/2019, 12/07/2019   Pneumococcal Conjugate-13 05/18/2014   Pneumococcal Polysaccharide-23 07/21/2001   Td 08/12/2002   Tdap 11/12/2011   Zoster, Live 01/04/2008    MEDICATIONS/ALLERGIES   Current Meds  Medication Sig   acetaminophen (TYLENOL) 325 MG tablet Take 650 mg by mouth every 6 (six) hours as needed for mild pain.   Chlorpheniramine Maleate (EQ CHLORTABS PO) Take 1 tablet by mouth daily. 4mg    Melatonin 5 MG CAPS Take 1 capsule by mouth at bedtime.   metoCLOPramide (REGLAN) 10 MG tablet Take 10 mg by mouth daily as needed.   topiramate (TOPAMAX) 50 MG tablet Take 50 mg by mouth 2 (two) times daily.   [DISCONTINUED] ELIQUIS 2.5 MG TABS tablet TAKE ONE TABLET BY MOUTH TWICE DAILY    Allergies  Allergen Reactions   Tramadol Anaphylaxis    Tighten up throat   Codeine  Nausea And Vomiting   Depakote [Divalproex Sodium] Other (See Comments)    hallucinations   Aspirin     Upset stomach   Brimonidine Tartrate     Caused fainting and dizziness    Cefadroxil     Doesn't remember reaction  Ibuprofen     Upset stomach   Rocklatan [Netarsudil-Latanoprost]     Caused fainting and dizziness   Ropinirole    Alendronate Sodium Nausea Only and Other (See Comments)    Stomach upset   Vitamin D Analogs Nausea Only    Stomach upset    SOCIAL HISTORY/FAMILY HISTORY   Reviewed in Epic:  Pertinent findings:  Social History   Tobacco Use   Smoking status: Never   Smokeless tobacco: Never  Substance Use Topics   Alcohol use: No   Drug use: No   Social History   Social History Narrative   Not on file    OBJCTIVE -PE, EKG, labs   Wt Readings from Last 3 Encounters:  02/12/22 119 lb (54 kg)  12/28/21 119 lb 9.6 oz (54.3 kg)  09/26/21 120 lb 9.6 oz (54.7 kg)    Physical Exam: BP 138/70   Pulse 70   Ht 5\' 3"  (1.6 m)   Wt 119 lb (54 kg)   SpO2 97%   BMI 21.08 kg/m  Physical Exam Vitals reviewed.  Constitutional:      General: She is not in acute distress.    Appearance: Normal appearance. She is normal weight. She is not ill-appearing or toxic-appearing.     Comments: Somewhat thin and frail elderly woman.  No obvious distress.  HENT:     Head: Normocephalic and atraumatic.     Ears:     Comments: Very hard of hearing Neck:     Vascular: No carotid bruit or JVD.  Cardiovascular:     Rate and Rhythm: Normal rate and regular rhythm. Occasional Extrasystoles are present.    Pulses: Normal pulses.     Heart sounds: S1 normal and S2 normal. Murmur (1/6 SEM at RUSB.) heard.    No friction rub. No gallop.  Pulmonary:     Effort: Pulmonary effort is normal. No respiratory distress.     Breath sounds: Normal breath sounds. No wheezing, rhonchi or rales.  Chest:     Chest wall: No tenderness.  Musculoskeletal:        General: Swelling  (mild) present. Normal range of motion.     Cervical back: Normal range of motion and neck supple.  Skin:    Comments: Mild spider veins . Varicose veins BLE - not overly engorged.  Minimal stasis dermatitis  Neurological:     General: No focal deficit present.     Mental Status: She is alert and oriented to person, place, and time.     Motor: Weakness (Legs are still somewhat weak) present.     Gait: Gait abnormal.  Psychiatric:        Mood and Affect: Mood normal.        Behavior: Behavior normal.     Comments: Poor memory.  Poor historian.    Adult ECG Report  Rate: 70 ;  Rhythm: normal sinus rhythm and normal axis, intervals and durations. ;   Narrative Interpretation: Stable  Recent Labs: Reviewed. Most recent lipid panel 09/27/2020: TC 227, TG 79, HDL 85, LDL 129. No results found for: CHOL, HDL, LDLCALC, LDLDIRECT, TRIG, CHOLHDL Lab Results  Component Value Date   CREATININE 0.66 03/29/2021   BUN 20 03/29/2021   NA 138 03/29/2021   K 3.7 03/29/2021   CL 105 03/29/2021   CO2 28 03/29/2021      Latest Ref Rng & Units 06/01/2021   11:37 AM 03/29/2021    4:00 PM 09/09/2008    4:44  AM  CBC  WBC 4.0 - 10.5 K/uL 5.7   4.8   7.3    Hemoglobin 12.0 - 15.0 g/dL 14.4   14.7   11.6 REPEATED TO VERIFY    Hematocrit 36.0 - 46.0 % 44.1   45.8   34.2    Platelets 150 - 400 K/uL 191   190   223 DELTA CHECK NOTED      No results found for: HGBA1C Lab Results  Component Value Date   TSH 1.323 03/29/2021   ==================================================  COVID-19 Education: The signs and symptoms of COVID-19 were discussed with the patient and how to seek care for testing (follow up with PCP or arrange E-visit).    I spent a total of 22 minutes with the patient spent in direct patient consultation.  Additional time spent with chart review  / charting (studies, outside notes, etc): 14 min Total Time: 36 min  Current medicines are reviewed at length with the patient today.   (+/- concerns) none  This visit occurred during the SARS-CoV-2 public health emergency.  Safety protocols were in place, including screening questions prior to the visit, additional usage of staff PPE, and extensive cleaning of exam room while observing appropriate contact time as indicated for disinfecting solutions.  Notice: This dictation was prepared with Dragon dictation along with smart phrase technology. Any transcriptional errors that result from this process are unintentional and may not be corrected upon review.  Studies Ordered:   Orders Placed This Encounter  Procedures   EKG 12-Lead    Patient Instructions / Medication Changes & Studies & Tests Ordered   Patient Instructions  Medication Instructions: No changes  If you think  you are in  afib  and symptomatic - you can take an extra Metoprolol  (RightThen) that day and if continues after 4 to 6 hour contact office   *If you need a refill on your cardiac medications before your next appointment, please call your pharmacy*   Lab Work: Not needed     Testing/Procedures: Not needed   Follow-Up: At [facility], you and your health needs are our priority.  As part of our continuing mission to provide you with exceptional heart care, we have created designated Provider Care Teams.  These Care Teams include your primary Cardiologist (physician) and Advanced Practice Providers (APPs -  Physician Assistants and Nurse Practitioners) who all work together to provide you with the care you need, when you need it.     Your next appointment:   7 to 8 month(s) Nov or Dec 2023  The format for your next appointment:   In Person  Provider:   Glenetta Hew, MD       Glenetta Hew, M.D., M.S. Interventional Cardiologist   Pager # [phone] Phone # [phone] [address] [place], [place] [address]   Thank you for choosing [facility] at [facility]!!

## 2022-02-19 DIAGNOSIS — D0439 Carcinoma in situ of skin of other parts of face: Secondary | ICD-10-CM | POA: Diagnosis not present

## 2022-02-19 DIAGNOSIS — Z85828 Personal history of other malignant neoplasm of skin: Secondary | ICD-10-CM | POA: Diagnosis not present

## 2022-03-01 ENCOUNTER — Other Ambulatory Visit (HOSPITAL_COMMUNITY): Payer: Self-pay | Admitting: Cardiology

## 2022-03-01 NOTE — Telephone Encounter (Signed)
Prescription refill request for Eliquis received. ?Indication:Afib ?Last office visit:4/23 ?Scr:0.6 ?Age: 88 ?Weight:54 kg ? ?Prescription refilled ? ?

## 2022-03-17 ENCOUNTER — Encounter: Payer: Self-pay | Admitting: Cardiology

## 2022-03-17 NOTE — Assessment & Plan Note (Addendum)
With increased stroke risk, and uncertainty about breakthrough episodes, she is on low-dose Eliquis 2.5 mg twice daily for stroke prevention.  No bleeding issues.  Thankfully, no significant falls.  Okay to hold Eliquis 2 to 3 days preop for surgeries or procedures, and may need to hold as much is 2 days postop

## 2022-03-17 NOTE — Assessment & Plan Note (Signed)
Continue to recommend foot elevation, and possible support stockings.  Would try to avoid diuretic.

## 2022-03-17 NOTE — Assessment & Plan Note (Signed)
She had that one prolonged episode and then another shorter episode that were quite likely associated with her being in A-fib.  Stressed importance of adequate hydration.  Allowing for permissive hypertension we will not titrate her beta-blocker any further.  Only recommended that if she feels was going to A-fib or noticed on cardia mobile but she can take an extra half tablet of Toprol.

## 2022-03-17 NOTE — Assessment & Plan Note (Signed)
Probably had 1 prolonged and 1 short episode of A-fib based on symptoms since her cataract surgery.  Unfortunately, we cannot ensure that it was or was not indeed A-fib.  They have purchased the [facility] APP/Sensor --> she knows to try to use this if she feels bad.  She also notes that she is not feeling well that she should probably contact police or family if not the office less EMS.  I would not want her driving when she is feeling so poorly.  If she is indeed in A-fib, and she has not missed a dose of Eliquis, they could then potentially cardiovert her in the emergency room and she can go home.  Also discussed using a extra dose of Toprol if she feels her going into A-fib as this may break it.  Continue Eliquis 2.5 mg twice daily

## 2022-03-17 NOTE — Assessment & Plan Note (Signed)
History of DVT.  No longer an issue now that she is on Eliquis.  She may have some mild postphlebitic symptoms in her legs with varicose veins.  Recommend support stockings and foot elevation.

## 2022-03-29 ENCOUNTER — Other Ambulatory Visit (HOSPITAL_COMMUNITY): Payer: Self-pay | Admitting: Cardiology

## 2022-04-01 ENCOUNTER — Ambulatory Visit (HOSPITAL_COMMUNITY): Payer: Medicare Other | Admitting: Physician Assistant

## 2022-05-07 DIAGNOSIS — Z23 Encounter for immunization: Secondary | ICD-10-CM | POA: Diagnosis not present

## 2022-05-07 DIAGNOSIS — G43909 Migraine, unspecified, not intractable, without status migrainosus: Secondary | ICD-10-CM | POA: Diagnosis not present

## 2022-05-07 DIAGNOSIS — I480 Paroxysmal atrial fibrillation: Secondary | ICD-10-CM | POA: Diagnosis not present

## 2022-05-08 DIAGNOSIS — K219 Gastro-esophageal reflux disease without esophagitis: Secondary | ICD-10-CM | POA: Diagnosis not present

## 2022-05-08 DIAGNOSIS — M810 Age-related osteoporosis without current pathological fracture: Secondary | ICD-10-CM | POA: Diagnosis not present

## 2022-05-13 ENCOUNTER — Other Ambulatory Visit: Payer: Self-pay

## 2022-05-13 ENCOUNTER — Telehealth: Payer: Self-pay | Admitting: Pharmacy Technician

## 2022-05-13 DIAGNOSIS — M810 Age-related osteoporosis without current pathological fracture: Secondary | ICD-10-CM

## 2022-05-13 NOTE — Telephone Encounter (Signed)
Auth Submission: NO AUTH NEEDED Payer: [employer] Medication & CPT/J Code(s) submitted: Reclast (Zolendronic acid) [id number] Route of submission (phone, fax, portal): PHONE: [phone] Auth type: Buy/Bill Units/visits requested: X1 DOSE Reference number: [facility] 05/13/22 Approval from: 05/13/22 to 08/13/22

## 2022-05-20 ENCOUNTER — Ambulatory Visit: Payer: Medicare Other

## 2022-05-22 ENCOUNTER — Ambulatory Visit (INDEPENDENT_AMBULATORY_CARE_PROVIDER_SITE_OTHER): Payer: Medicare Other

## 2022-05-22 VITALS — BP 135/69 | HR 56 | Temp 98.5°F | Resp 16 | Ht 64.0 in | Wt 118.8 lb

## 2022-05-22 DIAGNOSIS — M810 Age-related osteoporosis without current pathological fracture: Secondary | ICD-10-CM

## 2022-05-22 MED ORDER — DIPHENHYDRAMINE HCL 25 MG PO CAPS
25.0000 mg | ORAL_CAPSULE | Freq: Once | ORAL | Status: AC
  Administered 2022-05-22: 25 mg via ORAL
  Filled 2022-05-22: qty 1

## 2022-05-22 MED ORDER — SODIUM CHLORIDE 0.9 % IV SOLN: INTRAVENOUS | Status: DC

## 2022-05-22 MED ORDER — ZOLEDRONIC ACID 5 MG/100ML IV SOLN
5.0000 mg | Freq: Once | INTRAVENOUS | Status: AC
  Administered 2022-05-22: 5 mg via INTRAVENOUS
  Filled 2022-05-22: qty 100

## 2022-05-22 MED ORDER — ACETAMINOPHEN 325 MG PO TABS
650.0000 mg | ORAL_TABLET | Freq: Once | ORAL | Status: AC
  Administered 2022-05-22: 650 mg via ORAL
  Filled 2022-05-22: qty 2

## 2022-05-22 NOTE — Progress Notes (Signed)
Diagnosis: Osteoporosis  Provider:  Chilton Greathouse, MD  Procedure: Infusion  IV Type: Peripheral, IV Location: L Antecubital  Reclast (Zolendronic Acid), Dose: 5 mg  Infusion Start Time: 1105  Infusion Stop Time: 1142  Post Infusion IV Care: Peripheral IV Discontinued  Discharge: Condition: Good, Destination: Home . AVS provided to patient.   Performed by:  Loney Hering, LPN

## 2022-06-03 DIAGNOSIS — H401413 Capsular glaucoma with pseudoexfoliation of lens, right eye, severe stage: Secondary | ICD-10-CM | POA: Diagnosis not present

## 2022-06-03 DIAGNOSIS — Z961 Presence of intraocular lens: Secondary | ICD-10-CM | POA: Diagnosis not present

## 2022-06-03 DIAGNOSIS — H401121 Primary open-angle glaucoma, left eye, mild stage: Secondary | ICD-10-CM | POA: Diagnosis not present

## 2022-06-11 DIAGNOSIS — G43719 Chronic migraine without aura, intractable, without status migrainosus: Secondary | ICD-10-CM | POA: Diagnosis not present

## 2022-08-16 ENCOUNTER — Other Ambulatory Visit (HOSPITAL_COMMUNITY): Payer: Self-pay | Admitting: Cardiology

## 2022-08-16 NOTE — Telephone Encounter (Signed)
Prescription refill request for Eliquis received. Indication:atrial fibriliation  Last office visit: Dr Ellyn Hack on 02/12/2022 Scr:0.77(on 05/08/2022) Age: 88  Weight:53.9 Kg   Appropriate to refill. Rx renewed for 3 months with 3 refills

## 2022-09-02 ENCOUNTER — Ambulatory Visit: Payer: Medicare Other | Attending: Cardiology | Admitting: Cardiology

## 2022-09-02 ENCOUNTER — Encounter: Payer: Self-pay | Admitting: Cardiology

## 2022-09-02 VITALS — BP 151/86 | HR 63 | Ht 63.0 in | Wt 121.2 lb

## 2022-09-02 DIAGNOSIS — I480 Paroxysmal atrial fibrillation: Secondary | ICD-10-CM

## 2022-09-02 DIAGNOSIS — R42 Dizziness and giddiness: Secondary | ICD-10-CM

## 2022-09-02 DIAGNOSIS — I824Z9 Acute embolism and thrombosis of unspecified deep veins of unspecified distal lower extremity: Secondary | ICD-10-CM

## 2022-09-02 DIAGNOSIS — D6869 Other thrombophilia: Secondary | ICD-10-CM

## 2022-09-02 DIAGNOSIS — R54 Age-related physical debility: Secondary | ICD-10-CM

## 2022-09-02 NOTE — Progress Notes (Signed)
Primary Care Provider: Sigmund Hazel, MD [facility] Cardiologist: Bryan Lemma, MD Electrophysiologist: None  Clinic Note: Chief Complaint  Patient presents with   Follow-up    Delayed 70-month follow-up.  Doing okay.  Kardia mobile device quit working.   Atrial Fibrillation    Has short little bursts but nothing prolonged.   ===================================  ASSESSMENT/PLAN   Problem List Items Addressed This Visit       Cardiology Problems   DVT (deep venous thrombosis) (HCC) (Chronic)    History of DVT.  No further clots.  Probably related to her venous stasis.  Also contributing to venous stasis.  Remains on Eliquis.  Continue to recommend support stockings, foot elevation and like her exercises.      Paroxysmal atrial fibrillation (HCC) - Primary (Chronic)    As far she can tell, she has not had any further episodes but her mobile device stopped working.  Her son and is trying to figure out how to get it working again.  She likes having that as a backup because she has had some episodes of quivering but nothing prolonged.  Overall pretty stable.  She has not had any dizziness or lightheadedness.  Recommend: Try to get the home monitor fixed. Continues on his Toprol 12.5 mg daily along with Eliquis.   Monitor renal function and weight closely to adjust Eliquis doses correctly. Discussed ER recommendations as far as recurrence of tachycardic spells with significant symptoms.      Relevant Orders   EKG 12-Lead (Completed)     Other   Hypercoagulable state due to atrial fibrillation (HCC)-CHA2DS2-VASc score 4 (HTN, female, age greater than 2); most likely score is 5 with vascular disease. (Chronic)    This patients CHA2DS2-VASc Score and unadjusted Ischemic Stroke Rate (% per year) is equal to 7.2 % stroke rate/year from a score of 5  Above score calculated as 1 point each if present [CHF, HTN, DM, Vascular=MI/PAD/Aortic Plaque, Age if 65-74, or  Female] Above score calculated as 2 points each if present [Age > 75, or Stroke/TIA/TE]  Remains on reduced dose Eliquis 2.5 mg twice daily based on age and weight.  No bleeding issues.  Okay to hold Eliquis 2 to 3 days preop for surgeries or procedures.  May hold up to 2 to 3 days postop. Also okay to hold for significant nosebleeds or other bleeding up to 2 to 3 days.       Dizziness, nonspecific (Chronic)    Most likely orthostatic symptoms.  Allow for some permissive hypertension.  Only on very low-dose metoprolol.  Would not taper further.  Continue to ensure aggressive hydration.      Frail elderly (Chronic)    Allow for permissive hypertension.  Is only on very low-dose Toprol for rate control.  Would not titrate further.  Allow for systolic pressures in the 160s over 90s if necessary as long as not sustained.  If pressures stay elevated, could take an additional half dose of Toprol.      ===================================  HPI:    Rachel Spencer is a 88 y.o. female with a PMH notable for PAF, history of DVT with bilateral extremity venous stasis-varicose veins who presents today for delayed 24-month follow-up.  Diagnosed with A-fib RVR on 03/29/2021 in setting of cataract surgery.  Was in A-fib upon arrival to the ER.  Had nausea and dizziness.   Spontaneously converted in the ER started metoprolol along with Eliquis (CHA2DS2-VASc score 3.)  Rachel Spencer was  last seen on February 12, 2022-he just purchased a [employer] device to monitor A-fib.  Was very happy with it.  Thankfully, she had not had any further episodes to check.  She thought that some of the palpitation episodes were associated with her being stressed.  She says her blood pressure is always high when she comes a doctor's office.  But otherwise is feeling well.  No episodes of palpitations.  Trying to maintain hydration.  No further dizzy.  Mild leg swelling at the end of the day, goes down with elevation.  Much  improved with STEMI dizziness.  Recent Hospitalizations: n/a  Reviewed  CV studies:    The following studies were reviewed today: (if available, images/films reviewed: From Epic Chart or Care Everywhere) N./a:  Interval History:   Rachel Spencer returns here today overall doing pretty well.  She has had a few episodes where she may feel her heart beating funny but [facility].  She is try to get her online to help with making it work.  They have contacted the company.   Hopefully can return it, and treat him for new 1.  Thankfully though she has not had any prolonged spells that would have worried her.  She dislikes having the the backup information.  She has been little bit stressed out lately and forgot take her medicines this morning.  She says her blood pressure is little higher than usually would be.  Thank you though she has not had any dizziness or wooziness.  No syncope or near syncope.  No chest pain or pressure.  No PND, orthopnea with no real edema.  Stable swelling that goes down with her feet elevation.  No TIA or amaurosis fugax.  No claudication.  REVIEWED OF SYSTEMS   Review of Systems  Constitutional:  Negative for malaise/fatigue.  HENT:  Negative for nosebleeds.   Respiratory:  Negative for cough, hemoptysis and shortness of breath.   Cardiovascular:  Positive for leg swelling (Stable ankle swelling).  Gastrointestinal:  Negative for blood in stool and melena.  Genitourinary:  Negative for dysuria and hematuria.  Musculoskeletal:  Positive for joint pain (Right shoulder more so than any other joint.).  Neurological:  Positive for dizziness (Off-and-on but much less prominent orthostatic dizziness.) and weakness (Generalized weakness.).  Psychiatric/Behavioral:  Positive for memory loss. Negative for depression. The patient is nervous/anxious. The patient does not have insomnia (Only if she is anxious).    I have reviewed and (if needed)  personally updated the patient's problem list, medications, allergies, past medical and surgical history, social and family history.   PAST MEDICAL HISTORY   Past Medical History:  Diagnosis Date   Arthritis    DVT (deep venous thrombosis) (HCC)    Esophageal reflux    Hypercoagulable state due to atrial fibrillation (HCC) 04/05/2021   Hyperlipidemia    Paroxysmal atrial fibrillation (HCC) 03/2021   Diagnosed with A-fib RVR on 03/29/2021 in setting of cataract surgery.  Was in A-fib upon arrival to the ER.  Had nausea and dizziness. => Spontaneously cardioverted in ER.  CHA2DS2-VASc score 3.  Rate control with metoprolol, DOAC-Eliquis 2.5 mg twice daily   Varicose veins    History of vein stripping    PAST SURGICAL HISTORY   Past Surgical History:  Procedure Laterality Date   APPENDECTOMY     skin grafting     on ulcers left ankle   TRANSTHORACIC ECHOCARDIOGRAM  06/01/2021   EF 60  to 65%.  GR 1 DD.  Aortic valve sclerosis but no stenosis.  Moderate TR but otherwise normal studies.   VEIN LIGATION AND STRIPPING      Immunization History  Administered Date(s) Administered   Influenza Split 09/13/2014   Influenza, High Dose Seasonal PF 07/27/2015, 08/27/2016, 08/05/2019   Influenza,inj,Quad PF,6+ Mos 09/02/2017, 08/24/2018   Influenza,inj,quad, With Preservative 07/28/2017   Influenza-Unspecified 08/03/2012   PFIZER(Purple Top)SARS-COV-2 Vaccination 11/16/2019, 12/07/2019   Pneumococcal Conjugate-13 05/18/2014   Pneumococcal Polysaccharide-23 07/21/2001   Td 08/12/2002   Tdap 11/12/2011   Zoster, Live 01/04/2008    MEDICATIONS/ALLERGIES   Current Meds  Medication Sig   acetaminophen (TYLENOL) 325 MG tablet Take 650 mg by mouth every 6 (six) hours as needed for mild pain.   apixaban (ELIQUIS) 2.5 MG TABS tablet Take 1 tablet (2.5 mg total) by mouth 2 (two) times daily.   Chlorpheniramine Maleate (EQ CHLORTABS PO) Take 1 tablet by mouth daily. 4mg    Melatonin 5 MG CAPS  Take 1 capsule by mouth at bedtime.   metoCLOPramide (REGLAN) 10 MG tablet Take 10 mg by mouth daily as needed.   metoprolol succinate (TOPROL-XL) 25 MG 24 hr tablet TAKE HALF TABLET BY MOUTH DAILY   naphazoline-glycerin (CLEAR EYES REDNESS) 0.012-0.25 % SOLN 1-2 drops 4 (four) times daily as needed for eye irritation.   RESTASIS 0.05 % ophthalmic emulsion Place 1 drop into the right eye 2 (two) times daily.   topiramate (TOPAMAX) 50 MG tablet Take 50 mg by mouth daily.    Allergies  Allergen Reactions   Tramadol Anaphylaxis    Tighten up throat   Codeine Nausea And Vomiting   Depakote [Divalproex Sodium] Other (See Comments)    hallucinations   Aspirin     Upset stomach   Brimonidine Tartrate     Caused fainting and dizziness    Cefadroxil     Doesn't remember reaction   Ibuprofen     Upset stomach   Rocklatan [Netarsudil-Latanoprost]     Caused fainting and dizziness   Ropinirole    Alendronate Sodium Nausea Only and Other (See Comments)    Stomach upset   Vitamin D Analogs Nausea Only    Stomach upset    SOCIAL HISTORY/FAMILY HISTORY   Reviewed in Epic:  Pertinent findings:  Social History   Tobacco Use   Smoking status: Never   Smokeless tobacco: Never  Substance Use Topics   Alcohol use: No   Drug use: No   Social History   Social History Narrative   Not on file    OBJCTIVE -PE, EKG, labs   Wt Readings from Last 3 Encounters:  09/02/22 121 lb 3.2 oz (55 kg)  05/22/22 118 lb 12.8 oz (53.9 kg)  02/12/22 119 lb (54 kg)    Physical Exam: BP (!) 151/86 (BP Location: Left Arm, Patient Position: Sitting)   Pulse 63   Ht 5\' 3"  (1.6 m)   Wt 121 lb 3.2 oz (55 kg)   SpO2 100%   BMI 21.47 kg/m  Physical Exam Vitals (Recheck blood pressure was 146/84 mmHg.) reviewed.  Constitutional:      General: She is not in acute distress.    Appearance: Normal appearance. She is not ill-appearing or toxic-appearing.     Comments: Somewhat thin, frail elderly woman.   Somewhat unsteady gait.  Soft and timid voice.  HENT:     Head: Normocephalic and atraumatic.  Neck:     Vascular: No carotid bruit or JVD.  Cardiovascular:     Rate and Rhythm: Normal rate and regular rhythm. No extrasystoles are present.    Chest Wall: PMI is not displaced.     Pulses: Intact distal pulses. Decreased pulses (Palpable but diminished).     Heart sounds: S1 normal and S2 normal. Murmur (1/6 SEM at RUSB) heard.     No friction rub. No gallop.  Pulmonary:     Effort: Pulmonary effort is normal. No respiratory distress.     Breath sounds: Normal breath sounds. No wheezing, rhonchi or rales.  Chest:     Chest wall: No tenderness.  Musculoskeletal:        General: Swelling (Trivial) present. Normal range of motion.     Cervical back: Normal range of motion and neck supple.  Skin:    General: Skin is warm and dry.     Coloration: Skin is pale.     Comments: Bilateral varicose veins with mild spider veins.  Not overly engorged.  Minimal stasis dermatitis.  Neurological:     General: No focal deficit present.     Mental Status: She is alert and oriented to person, place, and time.     Gait: Gait abnormal.  Psychiatric:        Mood and Affect: Mood normal.        Behavior: Behavior normal.        Thought Content: Thought content normal.        Judgment: Judgment normal.     Comments: Some memory loss.  Poor historian     Adult ECG Report  Rate: 63;  Rhythm: Normal sinus rhythm.  Normal axis, intervals and durations.;   Narrative Interpretation: Stable  Recent Labs: No recent labs. July 2023: Cr 0.77. No results found for: "CHOL", "HDL", "LDLCALC", "LDLDIRECT", "TRIG", "CHOLHDL" Lab Results  Component Value Date   CREATININE 0.66 03/29/2021   BUN 20 03/29/2021   NA 138 03/29/2021   K 3.7 03/29/2021   CL 105 03/29/2021   CO2 28 03/29/2021      Latest Ref Rng & Units 06/01/2021   11:37 AM 03/29/2021    4:00 PM 09/09/2008    4:44 AM  CBC  WBC 4.0 - 10.5 K/uL  5.7  4.8  7.3   Hemoglobin 12.0 - 15.0 g/dL 14.4  14.7  11.6 REPEATED TO VERIFY   Hematocrit 36.0 - 46.0 % 44.1  45.8  34.2   Platelets 150 - 400 K/uL 191  190  223 DELTA CHECK NOTED     No results found for: "HGBA1C" Lab Results  Component Value Date   TSH 1.323 03/29/2021    ================================================== I spent a total of 20 minutes with the patient spent in direct patient consultation.  Additional time spent with chart review  / charting (studies, outside notes, etc): 13 min Total Time: 33 min  Current medicines are reviewed at length with the patient today.  (+/- concerns) none  Notice: This dictation was prepared with Dragon dictation along with smart phrase technology. Any transcriptional errors that result from this process are unintentional and may not be corrected upon review.  Studies Ordered:   Orders Placed This Encounter  Procedures   EKG 12-Lead   No orders of the defined types were placed in this encounter.   Patient Instructions / Medication Changes & Studies & Tests Ordered   Patient Instructions  Medication Instructions:   No changes  *If you need a refill on your cardiac medications before your next appointment, please call  your pharmacy*   Lab Work: Not needed    Testing/Procedures: Not needed   Follow-Up: At [facility], you and your health needs are our priority.  As part of our continuing mission to provide you with exceptional heart care, we have created designated Provider Care Teams.  These Care Teams include your primary Cardiologist (physician) and Advanced Practice Providers (APPs -  Physician Assistants and Nurse Practitioners) who all work together to provide you with the care you need, when you need it.     Your next appointment:   6 month(s)  The format for your next appointment:   In Person  Provider:   Bryan Lemma, MD         Marykay Lex, MD, MS Bryan Lemma, M.D.,  M.S. Interventional Cardiologist  [facility]  Pager # [phone] Phone # [phone] [address] [place], [place] [address]   Thank you for choosing [facility] [place]!!

## 2022-09-02 NOTE — Patient Instructions (Signed)

## 2022-09-04 ENCOUNTER — Other Ambulatory Visit (HOSPITAL_COMMUNITY): Payer: Self-pay | Admitting: Cardiology

## 2022-09-29 ENCOUNTER — Encounter: Payer: Self-pay | Admitting: Cardiology

## 2022-09-29 DIAGNOSIS — R54 Age-related physical debility: Secondary | ICD-10-CM | POA: Insufficient documentation

## 2022-09-29 NOTE — Assessment & Plan Note (Signed)
History of DVT.  No further clots.  Probably related to her venous stasis.  Also contributing to venous stasis.  Remains on Eliquis.  Continue to recommend support stockings, foot elevation and like her exercises.

## 2022-09-29 NOTE — Assessment & Plan Note (Signed)
Most likely orthostatic symptoms.  Allow for some permissive hypertension.  Only on very low-dose metoprolol.  Would not taper further.  Continue to ensure aggressive hydration.

## 2022-09-29 NOTE — Assessment & Plan Note (Signed)
This patients CHA2DS2-VASc Score and unadjusted Ischemic Stroke Rate (% per year) is equal to 7.2 % stroke rate/year from a score of 5  Above score calculated as 1 point each if present [CHF, HTN, DM, Vascular=MI/PAD/Aortic Plaque, Age if 65-74, or Female] Above score calculated as 2 points each if present [Age > 75, or Stroke/TIA/TE]  Remains on reduced dose Eliquis 2.5 mg twice daily based on age and weight.  No bleeding issues.  Okay to hold Eliquis 2 to 3 days preop for surgeries or procedures.  May hold up to 2 to 3 days postop. Also okay to hold for significant nosebleeds or other bleeding up to 2 to 3 days.

## 2022-09-29 NOTE — Assessment & Plan Note (Signed)
Allow for permissive hypertension.  Is only on very low-dose Toprol for rate control.  Would not titrate further.  Allow for systolic pressures in the 160s over 90s if necessary as long as not sustained.  If pressures stay elevated, could take an additional half dose of Toprol.

## 2022-09-29 NOTE — Assessment & Plan Note (Addendum)
As far she can tell, she has not had any further episodes but her mobile device stopped working.  Her son and is trying to figure out how to get it working again.  She likes having that as a backup because she has had some episodes of quivering but nothing prolonged.  Overall pretty stable.  She has not had any dizziness or lightheadedness.  Recommend: Try to get the home monitor fixed. Continues on his Toprol 12.5 mg daily along with Eliquis.   Monitor renal function and weight closely to adjust Eliquis doses correctly. Discussed ER recommendations as far as recurrence of tachycardic spells with significant symptoms.

## 2022-10-01 DIAGNOSIS — Z1231 Encounter for screening mammogram for malignant neoplasm of breast: Secondary | ICD-10-CM | POA: Diagnosis not present

## 2022-10-09 DIAGNOSIS — H401413 Capsular glaucoma with pseudoexfoliation of lens, right eye, severe stage: Secondary | ICD-10-CM | POA: Diagnosis not present

## 2022-10-09 DIAGNOSIS — H401121 Primary open-angle glaucoma, left eye, mild stage: Secondary | ICD-10-CM | POA: Diagnosis not present

## 2022-10-09 DIAGNOSIS — Z961 Presence of intraocular lens: Secondary | ICD-10-CM | POA: Diagnosis not present

## 2022-11-04 DIAGNOSIS — I480 Paroxysmal atrial fibrillation: Secondary | ICD-10-CM | POA: Diagnosis not present

## 2022-11-04 DIAGNOSIS — Z0000 Encounter for general adult medical examination without abnormal findings: Secondary | ICD-10-CM | POA: Diagnosis not present

## 2022-11-12 DIAGNOSIS — K080 Exfoliation of teeth due to systemic causes: Secondary | ICD-10-CM | POA: Diagnosis not present

## 2022-11-26 DIAGNOSIS — M810 Age-related osteoporosis without current pathological fracture: Secondary | ICD-10-CM | POA: Diagnosis not present

## 2022-11-26 DIAGNOSIS — M8589 Other specified disorders of bone density and structure, multiple sites: Secondary | ICD-10-CM | POA: Diagnosis not present

## 2022-12-23 DIAGNOSIS — G43719 Chronic migraine without aura, intractable, without status migrainosus: Secondary | ICD-10-CM | POA: Diagnosis not present

## 2023-02-03 ENCOUNTER — Ambulatory Visit
Admission: EM | Admit: 2023-02-03 | Discharge: 2023-02-03 | Disposition: A | Discharge status: Home / Self Care | Payer: Medicare Other | Attending: Urgent Care | Admitting: Urgent Care

## 2023-02-03 ENCOUNTER — Ambulatory Visit (INDEPENDENT_AMBULATORY_CARE_PROVIDER_SITE_OTHER): Payer: Medicare Other

## 2023-02-03 DIAGNOSIS — M25562 Pain in left knee: Secondary | ICD-10-CM | POA: Diagnosis not present

## 2023-02-03 DIAGNOSIS — M1712 Unilateral primary osteoarthritis, left knee: Secondary | ICD-10-CM | POA: Diagnosis not present

## 2023-02-03 MED ORDER — PREDNISONE 20 MG PO TABS: 20.0000 mg | ORAL_TABLET | Freq: Every day | ORAL | 0 refills | Status: DC

## 2023-02-03 NOTE — ED Provider Notes (Signed)
[facility] - URG[facility]  Note:  This document was prepared using Conservation officer, historic buildings and may include unintentional dictation errors.  MRN: [id number] DOB: 03/28/1934  Subjective:   Rachel Spencer is a 89 y.o. female presenting for acute on chronic left knee pain.  Symptoms were much worse earlier today.  No fall, trauma, redness, swelling.  She does have an orthopedist.  Has a history of atrial fibrillation.  No current facility-administered medications for this encounter.  Current Outpatient Medications:    acetaminophen (TYLENOL) 325 MG tablet, Take 650 mg by mouth every 6 (six) hours as needed for mild pain., Disp: , Rfl:    apixaban (ELIQUIS) 2.5 MG TABS tablet, Take 1 tablet (2.5 mg total) by mouth 2 (two) times daily., Disp: 180 tablet, Rfl: 3   Chlorpheniramine Maleate (EQ CHLORTABS PO), Take 1 tablet by mouth daily. 4mg , Disp: , Rfl:    Melatonin 5 MG CAPS, Take 1 capsule by mouth at bedtime., Disp: , Rfl:    metoCLOPramide (REGLAN) 10 MG tablet, Take 10 mg by mouth daily as needed., Disp: , Rfl:    metoprolol succinate (TOPROL-XL) 25 MG 24 hr tablet, Take one-half tablet by mouth daily, Disp: 45 tablet, Rfl: 2   naphazoline-glycerin (CLEAR EYES REDNESS) 0.012-0.25 % SOLN, 1-2 drops 4 (four) times daily as needed for eye irritation., Disp: , Rfl:    RESTASIS 0.05 % ophthalmic emulsion, Place 1 drop into the right eye 2 (two) times daily., Disp: , Rfl:    topiramate (TOPAMAX) 50 MG tablet, Take 50 mg by mouth daily., Disp: , Rfl:    Allergies  Allergen Reactions   Tramadol Anaphylaxis    Tighten up throat   Codeine Nausea And Vomiting   Depakote [Divalproex Sodium] Other (See Comments)    hallucinations   Aspirin     Upset stomach   Brimonidine Tartrate     Caused fainting and dizziness    Cefadroxil     Doesn't remember reaction   Ibuprofen     Upset stomach   Rocklatan [Netarsudil-Latanoprost]     Caused fainting and dizziness   Ropinirole     Alendronate Sodium Nausea Only and Other (See Comments)    Stomach upset   Vitamin D Analogs Nausea Only    Stomach upset    Past Medical History:  Diagnosis Date   Arthritis    DVT (deep venous thrombosis)    Esophageal reflux    Hypercoagulable state due to atrial fibrillation 04/05/2021   Hyperlipidemia    Paroxysmal atrial fibrillation 03/2021   Diagnosed with A-fib RVR on 03/29/2021 in setting of cataract surgery.  Was in A-fib upon arrival to the ER.  Had nausea and dizziness. => Spontaneously cardioverted in ER.  CHA2DS2-VASc score 3.  Rate control with metoprolol, DOAC-Eliquis 2.5 mg twice daily   Varicose veins    History of vein stripping     Past Surgical History:  Procedure Laterality Date   APPENDECTOMY     skin grafting     on ulcers left ankle   TRANSTHORACIC ECHOCARDIOGRAM  06/01/2021   EF 60 to 65%.  GR 1 DD.  Aortic valve sclerosis but no stenosis.  Moderate TR but otherwise normal studies.   VEIN LIGATION AND STRIPPING      Family History  Problem Relation Age of Onset   Heart disease Mother    Hypertension Mother    Heart disease Father    Hyperlipidemia Father    Hypertension Father  Social History   Tobacco Use   Smoking status: Never   Smokeless tobacco: Never  Vaping Use   Vaping Use: Never used  Substance Use Topics   Alcohol use: No   Drug use: No    ROS   Objective:   Vitals: There were no vitals taken for this visit.  Physical Exam Constitutional:      General: She is not in acute distress.    Appearance: Normal appearance. She is well-developed. She is not ill-appearing, toxic-appearing or diaphoretic.  HENT:     Head: Normocephalic and atraumatic.     Nose: Nose normal.     Mouth/Throat:     Mouth: Mucous membranes are moist.  Eyes:     General: No scleral icterus.       Right eye: No discharge.        Left eye: No discharge.     Extraocular Movements: Extraocular movements intact.  Cardiovascular:     Rate and  Rhythm: Normal rate.  Pulmonary:     Effort: Pulmonary effort is normal.  Musculoskeletal:     Left knee: No swelling, deformity, effusion, erythema, ecchymosis, lacerations, bony tenderness or crepitus. Decreased range of motion. No tenderness. Normal alignment and normal patellar mobility.  Skin:    General: Skin is warm and dry.  Neurological:     General: No focal deficit present.     Mental Status: She is alert and oriented to person, place, and time.  Psychiatric:        Mood and Affect: Mood normal.        Behavior: Behavior normal.    DG Knee Complete 4 Views Left  Result Date: 02/03/2023 CLINICAL DATA:  Intermittent left knee pain without trauma EXAM: LEFT KNEE - COMPLETE 4+ VIEW COMPARISON:  None Available. FINDINGS: Surgical clips about the medial knee and upper calf. Mild medial, moderate lateral and patellofemoral compartment joint space narrowing with subchondral sclerosis. More vague heterogeneously increased density about the distal femur and proximal tibia. No knee joint effusion. Vascular calcifications. IMPRESSION: 3 compartment osteoarthritis, without acute superimposed process. Apparent heterogeneously increased density involving the distal femur and proximal tibia. Although this could be artifactual/technique related, systemic causes such as renal osteodystrophy or prior bone infarcts could have a similar appearance. Electronically Signed   By: Jeronimo Greaves M.D.   On: 02/03/2023 12:03    I applied a 4 inch Ace wrap to the left knee.  Assessment and Plan :   PDMP not reviewed this encounter.  1. Acute pain of left knee   2. Osteoarthritis of left knee, unspecified osteoarthritis type     I cannot elicit any specific pain on exam.  As such and in the context of her having atrial fibrillation, offered a lower dose of prednisone course.  She is to follow-up closely with an orthopedist for consideration of an MRI. Counseled patient on potential for adverse effects with  medications prescribed/recommended today, ER and return-to-clinic precautions discussed, patient verbalized understanding.    Wallis Bamberg, [place] 02/03/23 1727

## 2023-02-03 NOTE — ED Triage Notes (Addendum)
Pt c/o intermittent mild left knee pain x 20 years-pain much worse since ~1130am-denies recent injury-NAD-slow steady gait

## 2023-02-13 DIAGNOSIS — M1712 Unilateral primary osteoarthritis, left knee: Secondary | ICD-10-CM | POA: Diagnosis not present

## 2023-03-04 DIAGNOSIS — Z961 Presence of intraocular lens: Secondary | ICD-10-CM | POA: Diagnosis not present

## 2023-03-04 DIAGNOSIS — H401413 Capsular glaucoma with pseudoexfoliation of lens, right eye, severe stage: Secondary | ICD-10-CM | POA: Diagnosis not present

## 2023-03-04 DIAGNOSIS — H401121 Primary open-angle glaucoma, left eye, mild stage: Secondary | ICD-10-CM | POA: Diagnosis not present

## 2023-03-10 ENCOUNTER — Encounter: Payer: Self-pay | Admitting: Cardiology

## 2023-03-10 ENCOUNTER — Ambulatory Visit: Payer: Medicare Other | Attending: Cardiology | Admitting: Cardiology

## 2023-03-10 VITALS — BP 138/76 | HR 69 | Ht 63.0 in | Wt 117.4 lb

## 2023-03-10 DIAGNOSIS — D6869 Other thrombophilia: Secondary | ICD-10-CM

## 2023-03-10 DIAGNOSIS — I83893 Varicose veins of bilateral lower extremities with other complications: Secondary | ICD-10-CM

## 2023-03-10 DIAGNOSIS — R42 Dizziness and giddiness: Secondary | ICD-10-CM | POA: Diagnosis not present

## 2023-03-10 DIAGNOSIS — I824Z9 Acute embolism and thrombosis of unspecified deep veins of unspecified distal lower extremity: Secondary | ICD-10-CM

## 2023-03-10 DIAGNOSIS — I480 Paroxysmal atrial fibrillation: Secondary | ICD-10-CM | POA: Diagnosis not present

## 2023-03-10 NOTE — Patient Instructions (Signed)
Medication Instructions:  No changes   *If you need a refill on your cardiac medications before your next appointment, please call your pharmacy*   Lab Work: Not needed   Testing/Procedures:  Not needed  Follow-Up: At [facility], you and your health needs are our priority.  As part of our continuing mission to provide you with exceptional heart care, we have created designated Provider Care Teams.  These Care Teams include your primary Cardiologist (physician) and Advanced Practice Providers (APPs -  Physician Assistants and Nurse Practitioners) who all work together to provide you with the care you need, when you need it.     Your next appointment:   6 month(s)  The format for your next appointment:   In Person  Provider:   Marjie Skiff, PA-C, Juanda Crumble, PA-C, Joni Reining, DNP, ANP, Bernadene Person, NP, or Carlos Levering, NP    Then, Bryan Lemma, MD will plan to see you again in 12 month(s).

## 2023-03-10 NOTE — Progress Notes (Signed)
Primary Care Provider: Sigmund Hazel, MD [facility] Cardiologist: Bryan Lemma, MD Electrophysiologist: None  Clinic Note: Chief Complaint  Patient presents with   Follow-up    6 months.  Doing well.   Atrial Fibrillation    Maybe few palpitations but nothing significant    ===================================  ASSESSMENT/PLAN   Problem List Items Addressed This Visit       Cardiology Problems   Varicose veins of bilateral lower extremities with other complications (Chronic)    Well-controlled.  Continue with leg elevation, support stockings, exercise.      Paroxysmal atrial fibrillation (HCC) - Primary (Chronic)    Rare breakthrough spells.  He eventually got Kardia-Mobile .  Is on very low-dose 25 mg Toprol along with Eliquis.  She is aware of when she should go to the ER for breakthrough spells-tachycardia that is symptomatic lasting couple hours.  Otherwise, she would like to avoid going to the ER.  She was called during the day (to consider cardioversion.).      Relevant Orders   EKG 12-Lead (Completed)   DVT (deep venous thrombosis) (HCC) (Chronic)    History of DVT with no further clots.  This is probably exacerbated her venous stasis.  Remains on Eliquis for A-fib  Continue recommend support stockings, foot elevation and foot/lower leg exercises.        Other   Hypercoagulable state due to atrial fibrillation (HCC)-CHA2DS2-VASc score 4 (HTN, female, age greater than 92); most likely score is 5 with vascular disease. (Chronic)    Thankfully no bleeding issues on Eliquis 5 mg twice daily.  She should have labs checked soon as needed continue to monitor renal function for appropriate dosing of Eliquis.  Okay to hold Eliquis PRN for procedures or surgeries-3 to 5 days.      Dizziness, nonspecific (Chronic)    Probably concern for with static symptoms.  Again (Hypertension Promus although the beta-blocker.  Ensure adequate hydration, support  stockings.  Avoid prolonged standing and excess heat.      Relevant Orders   EKG 12-Lead (Completed)   I recommended that she discuss the left-sided neck nodule with her PCP.  ===================================  HPI:    Rachel Spencer is a 89 y.o. female with a PMH notable for PAF (diagnosed in June 2022 during cataract surgery), history of DVT and BLE Venous Stasis/Varicose Veins who presents today for 67-month follow-up at the request of Sigmund Hazel, MD.  Rachel Spencer was last seen on September 02, 2022: She was doing pretty well.  She had a few episodes that her heart was beating funny but her [place] was not working well.  They actually had to buy a new one.  Thankfully, then there was no prolonged spells.  She had forgotten to take her medication that morning and was very stressed out about it.  Her BP was somewhat elevated.  But otherwise asymptomatic.  Trivial end of day swelling.  Recent Hospitalizations:  02/03/2023: Urgent care visit-acute on chronic left knee pain.  She noted that all of a sudden her left knee started hurting she thinks that she may have turned and an unusual way and her knee Gave out on her.  It still hurts her now but is definitely better.  Has been seen by [facility] recently -> had an injection on April 18 which helped some.  Dr. Renne Crigler the possibility of using a knee brace as well as ice etc.  Reviewed  CV studies:    The following  studies were reviewed today: (if available, images/films reviewed: From Epic Chart or Care Everywhere) No recent studies:  Interval History:   Rachel Spencer returns here today for 4-month follow-up doing fairly well.  She says that she may get little short of breath if she overexerts herself but she really does not do a whole lot of activity.  She is active doing chores around the house, but does not really do much that would be considered exertional.  She still has some dizziness off and on and is doing the best she can to try  to make sure that she drinks 20 of water.  She is does not remember to do it all the time.  Swelling is pretty well-controlled by elevating her feet in the evenings but also during the day.  Other than the fall that she had where she hurt her left knee and this nodule on her left neck she has not really had any major problems.  No new concerns.  CV Review of Symptoms (Summary): positive for - dyspnea on exertion, edema, irregular heartbeat, palpitations, and all this is relatively stable; orthostatic dizziness negative for - chest pain, orthopnea, paroxysmal nocturnal dyspnea, rapid heart rate, shortness of breath, or syncope or near syncope, TIA/amaurosis fugax or claudication  REVIEWED OF SYSTEMS   Review of Systems  Constitutional:  Negative for malaise/fatigue and weight loss.  HENT:  Negative for nosebleeds.   Respiratory:  Negative for cough, hemoptysis and shortness of breath.   Cardiovascular:  Positive for palpitations (Rarely).  Gastrointestinal:  Negative for abdominal pain, blood in stool and melena.  Genitourinary:  Negative for hematuria.  Musculoskeletal:  Positive for joint pain (Right shoulder and left knee (see above)) and neck pain (At some discomfort and some stiffness associate with the left side of her neck she feels a soft nodule there.).  Neurological:  Positive for dizziness (Occasionally if she stands up too quickly) and focal weakness (Only when her left knee gave out). Negative for weakness.  Psychiatric/Behavioral:  Positive for memory loss (Gets a few things every now and then.). Negative for depression. The patient is nervous/anxious (Pretty well-controlled).    I have reviewed and (if needed) personally updated the patient's problem list, medications, allergies, past medical and surgical history, social and family history.   PAST MEDICAL HISTORY   Past Medical History:  Diagnosis Date   Arthritis    DVT (deep venous thrombosis) (HCC)    Esophageal reflux     Hypercoagulable state due to atrial fibrillation (HCC) 04/05/2021   Hyperlipidemia    Paroxysmal atrial fibrillation (HCC) 03/2021   Diagnosed with A-fib RVR on 03/29/2021 in setting of cataract surgery.  Was in A-fib upon arrival to the ER.  Had nausea and dizziness. => Spontaneously cardioverted in ER.  CHA2DS2-VASc score 3.  Rate control with metoprolol, DOAC-Eliquis 2.5 mg twice daily   Varicose veins    History of vein stripping    PAST SURGICAL HISTORY   Past Surgical History:  Procedure Laterality Date   APPENDECTOMY     skin grafting     on ulcers left ankle   TRANSTHORACIC ECHOCARDIOGRAM  06/01/2021   EF 60 to 65%.  GR 1 DD.  Aortic valve sclerosis but no stenosis.  Moderate TR but otherwise normal studies.   VEIN LIGATION AND STRIPPING     MEDICATIONS/ALLERGIES   Current Meds  Medication Sig   acetaminophen (TYLENOL) 325 MG tablet Take 650 mg by mouth every 6 (six) hours as needed  for mild pain.   apixaban (ELIQUIS) 2.5 MG TABS tablet Take 1 tablet (2.5 mg total) by mouth 2 (two) times daily.   Chlorpheniramine Maleate (EQ CHLORTABS PO) Take 1 tablet by mouth daily. 4mg    Melatonin 5 MG CAPS Take 1 capsule by mouth at bedtime.   metoCLOPramide (REGLAN) 10 MG tablet Take 10 mg by mouth daily as needed.   metoprolol succinate (TOPROL-XL) 25 MG 24 hr tablet Take one-half tablet by mouth daily   naphazoline-glycerin (CLEAR EYES REDNESS) 0.012-0.25 % SOLN 1-2 drops 4 (four) times daily as needed for eye irritation.   topiramate (TOPAMAX) 50 MG tablet Take 50 mg by mouth daily.    Allergies  Allergen Reactions   Tramadol Anaphylaxis    Tighten up throat   Codeine Nausea And Vomiting   Depakote [Divalproex Sodium] Other (See Comments)    hallucinations   Aspirin     Upset stomach   Brimonidine Tartrate     Caused fainting and dizziness    Cefadroxil     Doesn't remember reaction   Ibuprofen     Upset stomach   Rocklatan [Netarsudil-Latanoprost]     Caused fainting  and dizziness   Ropinirole    Alendronate Sodium Nausea Only and Other (See Comments)    Stomach upset   Vitamin D Analogs Nausea Only    Stomach upset    SOCIAL HISTORY/FAMILY HISTORY   Reviewed in Epic:  Pertinent findings:  Social History   Tobacco Use   Smoking status: Never   Smokeless tobacco: Never  Vaping Use   Vaping Use: Never used  Substance Use Topics   Alcohol use: No   Drug use: No   Social History   Social History Narrative   Not on file    OBJCTIVE -PE, EKG, labs   Wt Readings from Last 3 Encounters:  03/10/23 117 lb 6.4 oz (53.3 kg)  09/02/22 121 lb 3.2 oz (55 kg)  05/22/22 118 lb 12.8 oz (53.9 kg)    Physical Exam: BP 138/76   Pulse 69   Ht 5\' 3"  (1.6 m)   Wt 117 lb 6.4 oz (53.3 kg)   SpO2 95%   BMI 20.80 kg/m  Physical Exam Constitutional:      General: She is not in acute distress.    Appearance: Normal appearance. She is not ill-appearing or toxic-appearing.     Comments: Somewhat thin, frail elderly woman.  Wavering soft/timid voice with stammer.  HENT:     Head: Normocephalic and atraumatic.     Ears:     Comments: Quite hard of hearing Neck:     Vascular: No carotid bruit, hepatojugular reflux or JVD.      Comments: Nodule on the left side -> mildly tender, soft.  Feels like a lipoma but could be shotty lymph node. Cardiovascular:     Rate and Rhythm: Normal rate and regular rhythm. No extrasystoles are present.    Chest Wall: PMI is not displaced.     Pulses: Intact distal pulses. Decreased pulses.     Heart sounds: S1 normal and S2 normal. Murmur (1/6 SEM at RUSB) heard.     No friction rub. No gallop.  Pulmonary:     Effort: Pulmonary effort is normal. No respiratory distress.     Breath sounds: Normal breath sounds. No wheezing, rhonchi or rales.  Musculoskeletal:     Cervical back: Normal range of motion and neck supple.  Skin:    General: Skin is warm and dry.  Coloration: Skin is pale.     Comments: Bilateral  varicose veins with mild spider veins.  Not overly engorged.  Minimal stasis dermatitis.  Neurological:     General: No focal deficit present.     Mental Status: She is alert and oriented to person, place, and time. Mental status is at baseline.     Gait: Gait abnormal.     Comments: Memory loss, poor historian.  Psychiatric:        Mood and Affect: Mood normal.        Behavior: Behavior normal.        Thought Content: Thought content normal.     Adult ECG Report  Rate: 69;  Rhythm: normal sinus rhythm; normal axis, intervals and durations.  Narrative Interpretation: Normal  Recent Labs: No new labs.  Should be to get labs checked by PCP No results found for: "CHOL", "HDL", "LDLCALC", "LDLDIRECT", "TRIG", "CHOLHDL" Lab Results  Component Value Date   CREATININE 0.66 03/29/2021   BUN 20 03/29/2021   NA 138 03/29/2021   K 3.7 03/29/2021   CL 105 03/29/2021   CO2 28 03/29/2021      Latest Ref Rng & Units 06/01/2021   11:37 AM 03/29/2021    4:00 PM 09/09/2008    4:44 AM  CBC  WBC 4.0 - 10.5 K/uL 5.7  4.8  7.3   Hemoglobin 12.0 - 15.0 g/dL 40.9  81.1  91.4 REPEATED TO VERIFY   Hematocrit 36.0 - 46.0 % 44.1  45.8  34.2   Platelets 150 - 400 K/uL 191  190  223 DELTA CHECK NOTED     No results found for: "HGBA1C" Lab Results  Component Value Date   TSH 1.323 03/29/2021    ================================================== I spent a total of 32 minutes with the patient spent in direct patient consultation.  Additional time spent with chart review  / charting (studies, outside notes, etc): 16 min Total Time: 48 min  Current medicines are reviewed at length with the patient today.  (+/- concerns) N/A  Notice: This dictation was prepared with Dragon dictation along with smart phrase technology. Any transcriptional errors that result from this process are unintentional and may not be corrected upon review.  Studies Ordered:   Orders Placed This Encounter  Procedures   EKG  12-Lead   No orders of the defined types were placed in this encounter.   Patient Instructions / Medication Changes & Studies & Tests Ordered   Patient Instructions  Medication Instructions:  No changes   *If you need a refill on your cardiac medications before your next appointment, please call your pharmacy*   Lab Work: Not needed   Testing/Procedures:  Not needed  Follow-Up: At [facility], you and your health needs are our priority.  As part of our continuing mission to provide you with exceptional heart care, we have created designated Provider Care Teams.  These Care Teams include your primary Cardiologist (physician) and Advanced Practice Providers (APPs -  Physician Assistants and Nurse Practitioners) who all work together to provide you with the care you need, when you need it.     Your next appointment:   6 month(s)  The format for your next appointment:   In Person  Provider:   Marjie Skiff, PA-C, Juanda Crumble, PA-C, Joni Reining, DNP, ANP, Bernadene Person, NP, or Carlos Levering, NP    Then, Bryan Lemma, MD will plan to see you again in 12 month(s).     Marykay Lex,  MD, MS Bryan Lemma, M.D., M.S. Interventional Cardiologist  [facility]  Pager # [phone] Phone # [phone] [address] [place], [place] [address]   Thank you for choosing [facility] [place]!!

## 2023-03-24 ENCOUNTER — Encounter: Payer: Self-pay | Admitting: Cardiology

## 2023-03-24 NOTE — Assessment & Plan Note (Signed)
Rare breakthrough spells.  He eventually got Kardia-Mobile .  Is on very low-dose 25 mg Toprol along with Eliquis.  She is aware of when she should go to the ER for breakthrough spells-tachycardia that is symptomatic lasting couple hours.  Otherwise, she would like to avoid going to the ER.  She was called during the day (to consider cardioversion.).

## 2023-03-24 NOTE — Assessment & Plan Note (Signed)
Probably concern for with static symptoms.  Again (Hypertension Promus although the beta-blocker.  Ensure adequate hydration, support stockings.  Avoid prolonged standing and excess heat.

## 2023-03-24 NOTE — Assessment & Plan Note (Signed)
Thankfully no bleeding issues on Eliquis 5 mg twice daily.  She should have labs checked soon as needed continue to monitor renal function for appropriate dosing of Eliquis.  Okay to hold Eliquis PRN for procedures or surgeries-3 to 5 days.

## 2023-03-24 NOTE — Assessment & Plan Note (Signed)
History of DVT with no further clots.  This is probably exacerbated her venous stasis.  Remains on Eliquis for A-fib  Continue recommend support stockings, foot elevation and foot/lower leg exercises.

## 2023-03-24 NOTE — Assessment & Plan Note (Signed)
Well-controlled.  Continue with leg elevation, support stockings, exercise.

## 2023-04-17 DIAGNOSIS — L570 Actinic keratosis: Secondary | ICD-10-CM | POA: Diagnosis not present

## 2023-04-17 DIAGNOSIS — D2262 Melanocytic nevi of left upper limb, including shoulder: Secondary | ICD-10-CM | POA: Diagnosis not present

## 2023-04-17 DIAGNOSIS — L821 Other seborrheic keratosis: Secondary | ICD-10-CM | POA: Diagnosis not present

## 2023-04-17 DIAGNOSIS — L814 Other melanin hyperpigmentation: Secondary | ICD-10-CM | POA: Diagnosis not present

## 2023-04-17 DIAGNOSIS — Z85828 Personal history of other malignant neoplasm of skin: Secondary | ICD-10-CM | POA: Diagnosis not present

## 2023-05-02 DIAGNOSIS — H01001 Unspecified blepharitis right upper eyelid: Secondary | ICD-10-CM | POA: Diagnosis not present

## 2023-05-07 DIAGNOSIS — H00021 Hordeolum internum right upper eyelid: Secondary | ICD-10-CM | POA: Diagnosis not present

## 2023-05-14 DIAGNOSIS — K219 Gastro-esophageal reflux disease without esophagitis: Secondary | ICD-10-CM | POA: Diagnosis not present

## 2023-05-14 DIAGNOSIS — M810 Age-related osteoporosis without current pathological fracture: Secondary | ICD-10-CM | POA: Diagnosis not present

## 2023-05-28 DIAGNOSIS — H401121 Primary open-angle glaucoma, left eye, mild stage: Secondary | ICD-10-CM | POA: Diagnosis not present

## 2023-05-28 DIAGNOSIS — H00021 Hordeolum internum right upper eyelid: Secondary | ICD-10-CM | POA: Diagnosis not present

## 2023-05-28 DIAGNOSIS — Z961 Presence of intraocular lens: Secondary | ICD-10-CM | POA: Diagnosis not present

## 2023-05-28 DIAGNOSIS — H401413 Capsular glaucoma with pseudoexfoliation of lens, right eye, severe stage: Secondary | ICD-10-CM | POA: Diagnosis not present

## 2023-06-09 ENCOUNTER — Other Ambulatory Visit (HOSPITAL_COMMUNITY): Payer: Self-pay | Admitting: Cardiology

## 2023-06-11 DIAGNOSIS — M810 Age-related osteoporosis without current pathological fracture: Secondary | ICD-10-CM | POA: Diagnosis not present

## 2023-06-17 ENCOUNTER — Other Ambulatory Visit: Payer: Self-pay

## 2023-06-17 DIAGNOSIS — G43019 Migraine without aura, intractable, without status migrainosus: Secondary | ICD-10-CM | POA: Diagnosis not present

## 2023-06-24 ENCOUNTER — Ambulatory Visit (INDEPENDENT_AMBULATORY_CARE_PROVIDER_SITE_OTHER): Payer: Medicare Other

## 2023-06-24 VITALS — BP 138/78 | HR 65 | Temp 98.5°F | Resp 16 | Ht 63.0 in | Wt 111.8 lb

## 2023-06-24 DIAGNOSIS — M810 Age-related osteoporosis without current pathological fracture: Secondary | ICD-10-CM

## 2023-06-24 MED ORDER — SODIUM CHLORIDE 0.9 % IV SOLN: INTRAVENOUS | Status: DC

## 2023-06-24 MED ORDER — ACETAMINOPHEN 325 MG PO TABS
650.0000 mg | ORAL_TABLET | Freq: Once | ORAL | Status: AC
  Administered 2023-06-24: 650 mg via ORAL
  Filled 2023-06-24: qty 2

## 2023-06-24 MED ORDER — DIPHENHYDRAMINE HCL 25 MG PO CAPS: 25.0000 mg | ORAL_CAPSULE | Freq: Once | ORAL | Status: DC

## 2023-06-24 MED ORDER — ZOLEDRONIC ACID 5 MG/100ML IV SOLN
5.0000 mg | Freq: Once | INTRAVENOUS | Status: AC
  Administered 2023-06-24: 5 mg via INTRAVENOUS
  Filled 2023-06-24: qty 100

## 2023-06-24 NOTE — Progress Notes (Signed)
Diagnosis: Osteoporosis  Provider:  Chilton Greathouse MD  Procedure: IV Infusion  IV Type: Peripheral, IV Location: L Antecubital  Reclast (Zolendronic Acid), Dose: 5 mg  Infusion Start Time: 1403  Infusion Stop Time: 1438  Post Infusion IV Care: Patient declined observation. PIV removed  Discharge: Condition: Good, Destination: Home . AVS Declined  Performed by:  Rico Ala, LPN

## 2023-07-03 DIAGNOSIS — K080 Exfoliation of teeth due to systemic causes: Secondary | ICD-10-CM | POA: Diagnosis not present

## 2023-07-09 DIAGNOSIS — H401413 Capsular glaucoma with pseudoexfoliation of lens, right eye, severe stage: Secondary | ICD-10-CM | POA: Diagnosis not present

## 2023-07-09 DIAGNOSIS — H401121 Primary open-angle glaucoma, left eye, mild stage: Secondary | ICD-10-CM | POA: Diagnosis not present

## 2023-07-09 DIAGNOSIS — Z961 Presence of intraocular lens: Secondary | ICD-10-CM | POA: Diagnosis not present

## 2023-08-15 ENCOUNTER — Other Ambulatory Visit: Payer: Self-pay | Admitting: Gastroenterology

## 2023-08-15 DIAGNOSIS — K529 Noninfective gastroenteritis and colitis, unspecified: Secondary | ICD-10-CM | POA: Diagnosis not present

## 2023-08-15 DIAGNOSIS — R634 Abnormal weight loss: Secondary | ICD-10-CM | POA: Diagnosis not present

## 2023-08-25 DIAGNOSIS — K529 Noninfective gastroenteritis and colitis, unspecified: Secondary | ICD-10-CM | POA: Diagnosis not present

## 2023-08-27 NOTE — Progress Notes (Unsigned)
Cardiology Office Note:    Date:  09/08/2023   ID:  Rachel Spencer, DOB [birth year], MRN [id number]  PCP:  Sigmund Hazel, MD  Cardiologist:  Bryan Lemma, MD     Referring MD: Sigmund Hazel, MD   Chief Complaint: follow-up of atrial fibrillation   History of Present Illness:    Rachel Spencer is a 89 y.o. female with a history of paroxysmal atrial fibrillation on Eliquis, varicose veins of bilateral lower extremities, prior DVT, and GERD who is followed by Dr. Herbie Baltimore and presents today for routine follow-up.   Patient is primarily followed by Dr. Herbie Baltimore for paroxysmal atrial fibrillation. Echo in 05/2021 showed LVEF of 60-65% with normal wall motion and grade 1 diastolic dysfunction, normal RV size and function, moderate TR, and mild AI. She was last seen by Dr. Herbie Baltimore in 02/2023 at which time she was doing okay and reported only rare breakthrough episodes of atrial fibrillation. She reported getting a little short of breath if she overexerts herself but reported not doing a whole lot of activity to begin with. She continued to report off and on dizziness as well and was trying to stay well hydrated. Her lower extremity edema was mostly well controlled with elevating her legs.   Patient presents today for follow-up. Here alone. Patient states that overall she has been doing okay from a cardiac standpoint. Her biggest issue lately has been a GI issues. She has had significant diarrhea the last few months and has lost 15 lbs. She saw a gastroenterologist and was told it was due to an infection associated with the norovirus. The patient has been advised to avoid dairy products and has made significant dietary changes, including eliminating fruits and certain other foods. She has had improvement in her diarrhea but will continue to have flares depending on what she eats.  She denies any palpitations or recurrent atrial fibrillation. No lightheadedness, dizziness, or syncope. She describes some  shortness of breath, fatigue, and generalized body aching (including some left sided chest discomfort when she overdoes it). Her 92 year old grandson and his 8 year son have been living with her for the past 7 months due to her grandson's financial difficulties. This living situation has increased the patient's workload leading to more frequent episodes of overexertion and associated fatigue and chest discomfort. She state when this happens she will have to sit down and rest for almost an hour before chest discomfort and generalized body aching resolve. She states sometimes holding her left breast will help the pain improve. She really thinks this is just due to overexerting herself. She is struggling with wanting to help her grandson/ great grandson but it sounds like it is becoming to much for her.   EKGs/Labs/Other Studies Reviewed:    The following studies were reviewed:  Echocardiogram 06/01/2021: Impressions: 1. Left ventricular ejection fraction, by estimation, is 60 to 65%. The  left ventricle has normal function. The left ventricle has no regional  wall motion abnormalities. Left ventricular diastolic parameters are  consistent with Grade I diastolic  dysfunction (impaired relaxation).   2. Right ventricular systolic function is normal. The right ventricular  size is normal. There is normal pulmonary artery systolic pressure. The  estimated right ventricular systolic pressure is 34.0 mmHg.   3. The mitral valve is normal in structure. Trivial mitral valve  regurgitation. No evidence of mitral stenosis.   4. Tricuspid valve regurgitation is moderate.   5. The aortic valve is tricuspid. Aortic valve  regurgitation is mild.  Mild aortic valve sclerosis is present, with no evidence of aortic valve  stenosis.   6. The inferior vena cava is dilated in size with >50% respiratory  variability, suggesting right atrial pressure of 8 mmHg.   EKG:  EKG ordered today. EKG  Interpretation Date/Time:  Monday September 08 2023 12:00:05 EST Ventricular Rate:  63 PR Interval:  144 QRS Duration:  74 QT Interval:  400 QTC Calculation: 409 R Axis:   73  Text Interpretation: Normal sinus rhythm Normal ECG When compared with ECG of 26-Sep-2021 13:30, No significant change was found Confirmed by Marjie Skiff [phone]) on 09/08/2023 12:01:35 PM    Recent Labs: No results found for requested labs within last 365 days.  Recent Lipid Panel No results found for: "CHOL", "TRIG", "HDL", "CHOLHDL", "VLDL", "LDLCALC", "LDLDIRECT"  Physical Exam:    Vital Signs: BP 136/80   Pulse 67   Ht 5\' 3"  (1.6 m)   Wt 110 lb (49.9 kg)   SpO2 98%   BMI 19.49 kg/m     Wt Readings from Last 3 Encounters:  09/08/23 110 lb (49.9 kg)  06/24/23 111 lb 12.8 oz (50.7 kg)  03/10/23 117 lb 6.4 oz (53.3 kg)     General: 89 y.o. thin Caucasian female in no acute distress. HEENT: Normocephalic and atraumatic.  Neck: Supple. No carotid bruits. No JVD. Heart: RRR. Distinct S1 and S2. No murmurs, gallops, or rubs.  Lungs: No increased work of breathing. Clear to ausculation bilaterally. No wheezes, rhonchi, or rales.  Abdomen: Soft, non-distended, and non-tender to palpation.  Extremities: No lower extremity edema.   Skin: Warm and dry. Neuro: No focal deficits. Psych: Normal affect. Responds appropriately.  Assessment:    1. Atypical chest pain   2. Paroxysmal atrial fibrillation (HCC)   3. Varicose veins of both lower extremities, unspecified whether complicated     Plan:    Atypical Chest Pain Patient describes some shortness of breath, fatigue, and generalized body aches (including some chest discomfort) when she overexerts herself. This is not new but has become more frequent since her work load has increased after her grandson and great grandson moved in with her.  - EKG today shows no acute ischemic changes.  - Patient thinks symptoms are just due to overdoing it which  is very possible given the fact she is 89 years old. I did offer a stress test but patient declined. Emphasized the importance of letting us know if symptoms worsen. Also encouraged her to take care of herself even if that means having to ask her grandson to move out or pitch in more around the apartment.   Paroxysmal Atrial Fibrillation Maintaining sinus rhythm. - Continue Toprol-XL 12.5mg  daliy.  - Continue Eliquis 2.5mg  twice daily. Reduced dose due to age and weight.   Varicose Veins of Bilateral Lower Extremities Stable. No edema on exam.   Disposition: Follow up in 6 months.    Signed, Corrin Parker, PA-C  09/08/2023 12:58 PM    [facility]

## 2023-08-28 ENCOUNTER — Ambulatory Visit
Admission: RE | Admit: 2023-08-28 | Discharge: 2023-08-28 | Disposition: A | Discharge status: Home / Self Care | Payer: Medicare Other | Source: Ambulatory Visit | Attending: Gastroenterology | Admitting: Gastroenterology

## 2023-08-28 DIAGNOSIS — R197 Diarrhea, unspecified: Secondary | ICD-10-CM | POA: Diagnosis not present

## 2023-08-28 DIAGNOSIS — I700 Atherosclerosis of aorta: Secondary | ICD-10-CM | POA: Diagnosis not present

## 2023-08-28 DIAGNOSIS — R634 Abnormal weight loss: Secondary | ICD-10-CM

## 2023-08-28 DIAGNOSIS — K8020 Calculus of gallbladder without cholecystitis without obstruction: Secondary | ICD-10-CM | POA: Diagnosis not present

## 2023-08-28 DIAGNOSIS — K529 Noninfective gastroenteritis and colitis, unspecified: Secondary | ICD-10-CM

## 2023-08-28 MED ORDER — IOPAMIDOL (ISOVUE-300) INJECTION 61%
200.0000 mL | Freq: Once | INTRAVENOUS | Status: AC | PRN
  Administered 2023-08-28: 100 mL via INTRAVENOUS

## 2023-09-08 ENCOUNTER — Encounter: Payer: Self-pay | Admitting: Student

## 2023-09-08 ENCOUNTER — Ambulatory Visit: Payer: Medicare Other | Attending: Student | Admitting: Student

## 2023-09-08 VITALS — BP 136/80 | HR 67 | Ht 63.0 in | Wt 110.0 lb

## 2023-09-08 DIAGNOSIS — I480 Paroxysmal atrial fibrillation: Secondary | ICD-10-CM

## 2023-09-08 DIAGNOSIS — I8393 Asymptomatic varicose veins of bilateral lower extremities: Secondary | ICD-10-CM

## 2023-09-08 DIAGNOSIS — R0789 Other chest pain: Secondary | ICD-10-CM

## 2023-09-08 NOTE — Patient Instructions (Signed)
Medication Instructions:  No Changes *If you need a refill on your cardiac medications before your next appointment, please call your pharmacy*   Lab Work: No Labs If you have labs (blood work) drawn today and your tests are completely normal, you will receive your results only by: MyChart Message (if you have MyChart) OR A paper copy in the mail If you have any lab test that is abnormal or we need to change your treatment, we will call you to review the results.   Testing/Procedures: No Testing   Follow-Up: At [facility], you and your health needs are our priority.  As part of our continuing mission to provide you with exceptional heart care, we have created designated Provider Care Teams.  These Care Teams include your primary Cardiologist (physician) and Advanced Practice Providers (APPs -  Physician Assistants and Nurse Practitioners) who all work together to provide you with the care you need, when you need it.  We recommend signing up for the patient portal called "MyChart".  Sign up information is provided on this After Visit Summary.  MyChart is used to connect with patients for Virtual Visits (Telemedicine).  Patients are able to view lab/test results, encounter notes, upcoming appointments, etc.  Non-urgent messages can be sent to your provider as well.   To learn more about what you can do with MyChart, go to [email or web address].    Your next appointment:   6 month(s)  Provider:   Marjie Skiff, PA-C

## 2023-09-23 ENCOUNTER — Telehealth: Payer: Self-pay | Admitting: Cardiology

## 2023-09-23 DIAGNOSIS — I480 Paroxysmal atrial fibrillation: Secondary | ICD-10-CM

## 2023-09-23 NOTE — Telephone Encounter (Signed)
Eliquis 2.5mg  refill request received. Patient is 89 years old, weight-49.9kg, Crea- 0.77 on 05/08/22 via scanned labs from PCP, Diagnosis-Afib, and last seen by Marjie Skiff on 09/08/23. Dose is appropriate based on dosing criteria.   Pt needs updated labs. Called PCP to get recent labs from August faxed over.

## 2023-09-23 NOTE — Telephone Encounter (Signed)
Called PCP and spoke with Debbie. Made them aware labs have not been received via fax. She states she resent the request as "high priority" and we should receive labs soon.

## 2023-09-23 NOTE — Telephone Encounter (Signed)
*  STAT* If patient is at the pharmacy, call can be transferred to refill team.   1. Which medications need to be refilled? (please list name of each medication and dose if known)  apixaban (ELIQUIS) 2.5 MG TABS tablet    2. Would you like to learn more about the convenience, safety, & potential cost savings by using the [facility]? N/A   3. Are you open to using the [facility] (Type [facility]. N/A   4. Which pharmacy/location (including street and city if local pharmacy) is medication to be sent to? [id number] - , [place] - [address]    5. Do they need a 30 day or 90 day supply? 90 day supply   Only has two days worth left

## 2023-09-24 MED ORDER — APIXABAN 2.5 MG PO TABS
2.5000 mg | ORAL_TABLET | Freq: Two times a day (BID) | ORAL | 1 refills | Status: DC
Start: 2023-09-24 — End: 2024-03-17

## 2023-09-24 NOTE — Telephone Encounter (Signed)
Labs received from PCP.  Scr 0.74 on 06/11/23.   Appropriate dose. Refill sent.

## 2023-10-03 DIAGNOSIS — K580 Irritable bowel syndrome with diarrhea: Secondary | ICD-10-CM | POA: Diagnosis not present

## 2023-11-11 DIAGNOSIS — Z1331 Encounter for screening for depression: Secondary | ICD-10-CM | POA: Diagnosis not present

## 2023-11-11 DIAGNOSIS — R490 Dysphonia: Secondary | ICD-10-CM | POA: Diagnosis not present

## 2023-11-11 DIAGNOSIS — R1310 Dysphagia, unspecified: Secondary | ICD-10-CM | POA: Diagnosis not present

## 2023-11-11 DIAGNOSIS — E049 Nontoxic goiter, unspecified: Secondary | ICD-10-CM | POA: Diagnosis not present

## 2023-11-11 DIAGNOSIS — Z0000 Encounter for general adult medical examination without abnormal findings: Secondary | ICD-10-CM | POA: Diagnosis not present

## 2023-11-12 DIAGNOSIS — H401121 Primary open-angle glaucoma, left eye, mild stage: Secondary | ICD-10-CM | POA: Diagnosis not present

## 2023-11-12 DIAGNOSIS — Z961 Presence of intraocular lens: Secondary | ICD-10-CM | POA: Diagnosis not present

## 2023-11-12 DIAGNOSIS — H401413 Capsular glaucoma with pseudoexfoliation of lens, right eye, severe stage: Secondary | ICD-10-CM | POA: Diagnosis not present

## 2023-11-24 DIAGNOSIS — Z1231 Encounter for screening mammogram for malignant neoplasm of breast: Secondary | ICD-10-CM | POA: Diagnosis not present

## 2023-12-03 DIAGNOSIS — R221 Localized swelling, mass and lump, neck: Secondary | ICD-10-CM | POA: Diagnosis not present

## 2023-12-03 DIAGNOSIS — E049 Nontoxic goiter, unspecified: Secondary | ICD-10-CM | POA: Diagnosis not present

## 2023-12-08 DIAGNOSIS — R490 Dysphonia: Secondary | ICD-10-CM | POA: Diagnosis not present

## 2023-12-08 DIAGNOSIS — J300 Vasomotor rhinitis: Secondary | ICD-10-CM | POA: Diagnosis not present

## 2023-12-08 DIAGNOSIS — R1310 Dysphagia, unspecified: Secondary | ICD-10-CM | POA: Diagnosis not present

## 2023-12-31 DIAGNOSIS — G43719 Chronic migraine without aura, intractable, without status migrainosus: Secondary | ICD-10-CM | POA: Diagnosis not present

## 2023-12-31 DIAGNOSIS — G43019 Migraine without aura, intractable, without status migrainosus: Secondary | ICD-10-CM | POA: Diagnosis not present

## 2024-01-05 DIAGNOSIS — R197 Diarrhea, unspecified: Secondary | ICD-10-CM | POA: Diagnosis not present

## 2024-02-10 DIAGNOSIS — Z23 Encounter for immunization: Secondary | ICD-10-CM | POA: Diagnosis not present

## 2024-02-10 DIAGNOSIS — I10 Essential (primary) hypertension: Secondary | ICD-10-CM | POA: Diagnosis not present

## 2024-02-10 DIAGNOSIS — I480 Paroxysmal atrial fibrillation: Secondary | ICD-10-CM | POA: Diagnosis not present

## 2024-02-12 DIAGNOSIS — K080 Exfoliation of teeth due to systemic causes: Secondary | ICD-10-CM | POA: Diagnosis not present

## 2024-03-02 DIAGNOSIS — J383 Other diseases of vocal cords: Secondary | ICD-10-CM | POA: Diagnosis not present

## 2024-03-02 DIAGNOSIS — R498 Other voice and resonance disorders: Secondary | ICD-10-CM | POA: Diagnosis not present

## 2024-03-02 DIAGNOSIS — R490 Dysphonia: Secondary | ICD-10-CM | POA: Diagnosis not present

## 2024-03-02 DIAGNOSIS — J385 Laryngeal spasm: Secondary | ICD-10-CM | POA: Diagnosis not present

## 2024-03-02 DIAGNOSIS — R1314 Dysphagia, pharyngoesophageal phase: Secondary | ICD-10-CM | POA: Diagnosis not present

## 2024-03-08 DIAGNOSIS — K080 Exfoliation of teeth due to systemic causes: Secondary | ICD-10-CM | POA: Diagnosis not present

## 2024-03-16 DIAGNOSIS — H401413 Capsular glaucoma with pseudoexfoliation of lens, right eye, severe stage: Secondary | ICD-10-CM | POA: Diagnosis not present

## 2024-03-16 DIAGNOSIS — H401121 Primary open-angle glaucoma, left eye, mild stage: Secondary | ICD-10-CM | POA: Diagnosis not present

## 2024-03-16 DIAGNOSIS — Z961 Presence of intraocular lens: Secondary | ICD-10-CM | POA: Diagnosis not present

## 2024-03-17 ENCOUNTER — Encounter: Payer: Self-pay | Admitting: Cardiology

## 2024-03-17 ENCOUNTER — Ambulatory Visit: Attending: Cardiology | Admitting: Cardiology

## 2024-03-17 VITALS — BP 156/68 | HR 60 | Ht 63.0 in | Wt 105.8 lb

## 2024-03-17 DIAGNOSIS — I83893 Varicose veins of bilateral lower extremities with other complications: Secondary | ICD-10-CM

## 2024-03-17 DIAGNOSIS — I824Z9 Acute embolism and thrombosis of unspecified deep veins of unspecified distal lower extremity: Secondary | ICD-10-CM

## 2024-03-17 DIAGNOSIS — D6869 Other thrombophilia: Secondary | ICD-10-CM | POA: Diagnosis not present

## 2024-03-17 DIAGNOSIS — I480 Paroxysmal atrial fibrillation: Secondary | ICD-10-CM | POA: Diagnosis not present

## 2024-03-17 DIAGNOSIS — R54 Age-related physical debility: Secondary | ICD-10-CM

## 2024-03-17 DIAGNOSIS — R42 Dizziness and giddiness: Secondary | ICD-10-CM

## 2024-03-17 DIAGNOSIS — R0989 Other specified symptoms and signs involving the circulatory and respiratory systems: Secondary | ICD-10-CM

## 2024-03-17 MED ORDER — APIXABAN 2.5 MG PO TABS: 2.5000 mg | ORAL_TABLET | Freq: Two times a day (BID) | ORAL | 1 refills | Status: DC

## 2024-03-17 NOTE — Progress Notes (Unsigned)
 Cardiology Office Note:  .   Date:  03/22/2024  ID:  Rachel Spencer, DOB August 09, 1934, MRN [id number] PCP: Perley Bradley, MD  [facility] Providers Cardiologist:  Randene Bustard, MD     No chief complaint on file.   Patient Profile: Rachel Spencer     Rachel Spencer is a somewhat frail 89 y.o. female with a PMH notable for PAF & h/o DVT (on Eliquis ), BLE Venous Stasis w/ varicse veins who presents here for 6 month f/u. She returns at the request of Perley Bradley, MD.    Rachel Spencer was last seen on 09/08/2023 by Sharren Decree, PA (as 6 month f/u): stable from CV standpoint. Mostly noted GI issues f diarrhea & ~15 lb wgt loss related to Norovirus.  No syncope or near syncope, just fatigue & some doe & body aches. Chest discomfort if she overdoes it. Soc Stressor: 50 y/o grandson & 8 y/o great-grandson were living with her 2/2 financial difficulties. With increased stress- more chest tightness - has to "stop & take a rest", often holds L breast. Becoming overwhelming.  -=> discussed & declined Stress Testing.  - Toprol  XL 12.5 mg, Eliquis  2.5 mg BID )for wgt & age)   Subjective  Discussed the use of AI scribe software for clinical note transcription with the patient, who gave verbal consent to proceed.  History of Present Illness Rachel Spencer is an 89 year old female with PAF & hypertension who presents for evaluation of blood pressure management.   Her blood pressure has been fluctuating, particularly since a norovirus infection last fall. Home readings average around 134/68 mmHg, but rise to 155 mmHg during medical visits. She is currently taking a full pill of metoprolol , after previously taking 1/2 pill. No dizziness, lightheadedness, or blurred vision associated with blood pressure changes.    She has a history of varicose veins and blood clots, which have led to ulcers in the past. She uses cortisone gel to manage skin issues related to this condition and emphasizes caution to prevent  further ulcers, as they have been extremely painful in the past.  She mentions a recent weight loss of over 15 pounds, now maintaining a weight of 105 pounds, attributed to dietary changes following the norovirus infection. She continues to experience gastrointestinal sensitivity, particularly to fruits and tomatoes, and uses a powder mixed with water to manage symptoms as needed.  She lives with her adult grandson and his eight-year-old child, which she finds stressful. This living situation contributes to her fatigue and stress levels, as she often takes on caregiving responsibilities and household tasks.  As previously noted, this is clearly a source of significant stress for Rachel Spencer.  She is reluctant to ask for assistance from other family members because they may forced him to leave the house which would then leave him stranded.  She experiences occasional migraines, managed with Topamax 50 mg as a preventive measure and Tylenol  during severe episodes. A severe episode two years ago required additional medication, but none have been needed since. Migraines affect her ability to read and perform daily activities, but she manages them with her current regimen.    Objective  Medications - Metoprolol  XL 25 mg daily; Eliquis  2.5 mg twice daily - Tylenol  as needed headache - Topamax 50 mg daily for headaches - PRN  Melatonin  Studies Reviewed: Rachel Spencer        ECHO 05/2021: LVEF 60-65%. Normal WM. Gr I DD. Normal RV - normal RVSP = but mildly elevated  LVEDP. Mod TR. MIld AoV Sclerosis with NO AS, mild AI.     Risk Assessment/Calculations:    CHA2DS2-VASc Score =     This indicates a  % annual risk of stroke. The patient's score is based upon:     HYPERTENSION CONTROL Vitals:   03/17/24 0950 Recheck  BP: (!) 170/70 (!) 156/68    Will monitor, reluctant to treat.  Allowing permissive hypertension.       Physical Exam:   VS:  BP (!) 156/68   Pulse 60   Ht 5\' 3"  (1.6 m)   Wt 105 lb 12.8  oz (48 kg)   SpO2 96%   BMI 18.74 kg/m    BP at home was 128/56 this morning was less than 110 yesterday. Wt Readings from Last 3 Encounters:  03/18/24 105 lb 12.8 oz (48 kg)  03/17/24 105 lb 12.8 oz (48 kg)  09/08/23 110 lb (49.9 kg)    GEN: Well nourished, well groomed in no acute distress; somewhat thin and frail, but otherwise relatively healthy for stated age. NECK: No JVD; No carotid bruits CARDIAC: RRR.  Normal rate.,Normal S1 & S2;  no murmurs, rubs, gallops RESPIRATORY:  Clear to auscultation without rales, wheezing or rhonchi ; nonlabored, good air movement. ABDOMEN: Soft, non-tender, non-distended EXTREMITIES:  No edema; No deformity     ASSESSMENT AND PLAN: .    Problem List Items Addressed This Visit       Cardiology Problems   DVT (deep venous thrombosis) (HCC) (Chronic)   History of DVT.  Remains on Eliquis  for A-fib.      Relevant Medications   metoprolol  succinate (TOPROL -XL) 25 MG 24 hr tablet   apixaban  (ELIQUIS ) 2.5 MG TABS tablet   Labile hypertension (Chronic)   Blood pressure generally within 130s/140s systolic. Occasional elevated readings likely stress-related. Prefers slightly higher blood pressure to avoid hypotension-related dizziness. - Recheck blood pressure before leaving the clinic. - Maintain current metoprolol  dosage. - Keep a log of blood pressure readings for a week before next visit.      Relevant Medications   metoprolol  succinate (TOPROL -XL) 25 MG 24 hr tablet   apixaban  (ELIQUIS ) 2.5 MG TABS tablet   Paroxysmal atrial fibrillation (HCC) - Primary (Chronic)   Seems to be in sinus rhythm today with no symptoms to suggest recurrent episodes.  .Current management with Eliquis  effective without bleeding issues. - [facility]. - Continue low-dose Toprol  25 mg daily for rate control.  She is aware of when she should go to the ER for breakthrough spells-tachycardia that is symptomatic lasting  couple hours. Otherwise, she would like to avoid going to the ER. She was called during the day (to consider cardioversion.).       Relevant Medications   metoprolol  succinate (TOPROL -XL) 25 MG 24 hr tablet   apixaban  (ELIQUIS ) 2.5 MG TABS tablet   Varicose veins of bilateral lower extremities with other complications (Chronic)   Stable.  Edema well-controlled. Recommend support stockings when seated or standing for long period time       Relevant Medications   metoprolol  succinate (TOPROL -XL) 25 MG 24 hr tablet   apixaban  (ELIQUIS ) 2.5 MG TABS tablet     Other   Dizziness, nonspecific (Chronic)   Concerned that some dizziness could be related to dehydration, inadequate p.o. intake. Weight seems to be stabilized now but I am concerned that she may have some orthostatic dizziness and therefore primary elected to be overly aggressive with  blood pressure management.      Frail elderly (Chronic)   History of orthostatic hypotension and unsteady gait.  Allow for permissive hypertension. Low threshold to consider stopping Eliquis  where she did have recurrent falls.      Hypercoagulable state due to atrial fibrillation (HCC)-CHA2DS2-VASc score 4 (HTN, female, age greater than 71); most likely score is 5 with vascular disease. (Chronic)         Follow-Up: Return in about 1 year (around 03/17/2025) for 1 Yr Follow-up, Alternating annual follow-ups APP and MD.  Recording duration: 26 minutes Total time spent: 26 min spent with patient + 15 min spent charting = 41 min I spent 41 minutes in the care of Rachel Spencer today including reviewing studies (2 minutes reviewing previous Echo), face to face time discussing treatment options (26 minutes), reviewing records from Previous clinic notes from APP's and my last note (6 minutes), 7 minutes dictating, and documenting in the encounter.    Signed, Arleen Lacer, MD, MS Randene Bustard, M.D., M.S. Interventional Tour manager  Pager # [phone]

## 2024-03-17 NOTE — Patient Instructions (Signed)
 Medication Instructions:  No changes    Okay if blood pressure range as high as 150 systoilc  *If you need a refill on your cardiac medications before your next appointment, please call your pharmacy*   Lab Work: Not needed    Testing/Procedures: Not needed   Follow-Up: At [facility], you and your health needs are our priority.  As part of our continuing mission to provide you with exceptional heart care, we have created designated Provider Care Teams.  These Care Teams include your primary Cardiologist (physician) and Advanced Practice Providers (APPs -  Physician Assistants and Nurse Practitioners) who all work together to provide you with the care you need, when you need it.     Your next appointment:   12 month(s)  The format for your next appointment:   In Person  Provider:   Randene Bustard, MD

## 2024-03-18 ENCOUNTER — Emergency Department (HOSPITAL_COMMUNITY)

## 2024-03-18 ENCOUNTER — Emergency Department (HOSPITAL_COMMUNITY)
Admission: EM | Admit: 2024-03-18 | Discharge: 2024-03-18 | Disposition: A | Discharge status: Home / Self Care | Attending: Emergency Medicine | Admitting: Emergency Medicine

## 2024-03-18 DIAGNOSIS — R112 Nausea with vomiting, unspecified: Secondary | ICD-10-CM | POA: Diagnosis not present

## 2024-03-18 DIAGNOSIS — R231 Pallor: Secondary | ICD-10-CM | POA: Diagnosis not present

## 2024-03-18 DIAGNOSIS — R001 Bradycardia, unspecified: Secondary | ICD-10-CM | POA: Diagnosis not present

## 2024-03-18 DIAGNOSIS — G4489 Other headache syndrome: Secondary | ICD-10-CM | POA: Diagnosis not present

## 2024-03-18 DIAGNOSIS — I6782 Cerebral ischemia: Secondary | ICD-10-CM | POA: Diagnosis not present

## 2024-03-18 DIAGNOSIS — J449 Chronic obstructive pulmonary disease, unspecified: Secondary | ICD-10-CM | POA: Insufficient documentation

## 2024-03-18 DIAGNOSIS — A419 Sepsis, unspecified organism: Secondary | ICD-10-CM | POA: Diagnosis not present

## 2024-03-18 DIAGNOSIS — R42 Dizziness and giddiness: Secondary | ICD-10-CM | POA: Diagnosis not present

## 2024-03-18 DIAGNOSIS — T68XXXA Hypothermia, initial encounter: Secondary | ICD-10-CM

## 2024-03-18 DIAGNOSIS — R531 Weakness: Secondary | ICD-10-CM | POA: Diagnosis not present

## 2024-03-18 DIAGNOSIS — R55 Syncope and collapse: Secondary | ICD-10-CM | POA: Diagnosis not present

## 2024-03-18 DIAGNOSIS — Z7901 Long term (current) use of anticoagulants: Secondary | ICD-10-CM | POA: Diagnosis not present

## 2024-03-18 DIAGNOSIS — I10 Essential (primary) hypertension: Secondary | ICD-10-CM | POA: Diagnosis not present

## 2024-03-18 LAB — URINALYSIS, ROUTINE W REFLEX MICROSCOPIC
Bilirubin Urine: NEGATIVE
Glucose, UA: 50 mg/dL — AB
Ketones, ur: NEGATIVE mg/dL
Leukocytes,Ua: NEGATIVE
Nitrite: NEGATIVE
Protein, ur: NEGATIVE mg/dL
Specific Gravity, Urine: 1.009 (ref 1.005–1.030)
pH: 8 (ref 5.0–8.0)

## 2024-03-18 LAB — CBC
HCT: 43.1 % (ref 36.0–46.0)
Hemoglobin: 14.3 g/dL (ref 12.0–15.0)
MCH: 30.2 pg (ref 26.0–34.0)
MCHC: 33.2 g/dL (ref 30.0–36.0)
MCV: 91.1 fL (ref 80.0–100.0)
Platelets: 186 10*3/uL (ref 150–400)
RBC: 4.73 MIL/uL (ref 3.87–5.11)
RDW: 12.8 % (ref 11.5–15.5)
WBC: 5.1 10*3/uL (ref 4.0–10.5)
nRBC: 0 % (ref 0.0–0.2)

## 2024-03-18 LAB — CBG MONITORING, ED: Glucose-Capillary: 134 mg/dL — ABNORMAL HIGH (ref 70–99)

## 2024-03-18 LAB — COMPREHENSIVE METABOLIC PANEL WITH GFR
ALT: 18 U/L (ref 0–44)
AST: 23 U/L (ref 15–41)
Albumin: 3.7 g/dL (ref 3.5–5.0)
Alkaline Phosphatase: 42 U/L (ref 38–126)
Anion gap: 11 (ref 5–15)
BUN: 19 mg/dL (ref 8–23)
CO2: 24 mmol/L (ref 22–32)
Calcium: 9.2 mg/dL (ref 8.9–10.3)
Chloride: 104 mmol/L (ref 98–111)
Creatinine, Ser: 0.65 mg/dL (ref 0.44–1.00)
GFR, Estimated: >60 mL/min (ref >60)
Glucose, Bld: 142 mg/dL — ABNORMAL HIGH (ref 70–99)
Potassium: 3.5 mmol/L (ref 3.5–5.1)
Sodium: 139 mmol/L (ref 135–145)
Total Bilirubin: 0.7 mg/dL (ref 0.0–1.2)
Total Protein: 6.5 g/dL (ref 6.5–8.1)

## 2024-03-18 LAB — I-STAT CG4 LACTIC ACID, ED
Lactic Acid, Venous: 1.6 mmol/L (ref 0.5–1.9)
Lactic Acid, Venous: 1 mmol/L (ref 0.5–1.9)

## 2024-03-18 MED ORDER — SODIUM CHLORIDE 0.9 % IV SOLN
12.5000 mg | Freq: Once | INTRAVENOUS | Status: DC
  Filled 2024-03-18: qty 0.5

## 2024-03-18 MED ORDER — MECLIZINE HCL 25 MG PO TABS
25.0000 mg | ORAL_TABLET | Freq: Once | ORAL | Status: DC
  Filled 2024-03-18: qty 1

## 2024-03-18 MED ORDER — SODIUM CHLORIDE 0.9 % IV BOLUS
1000.0000 mL | Freq: Once | INTRAVENOUS | Status: AC
  Administered 2024-03-18: 1000 mL via INTRAVENOUS

## 2024-03-18 MED ORDER — ONDANSETRON HCL 4 MG/2ML IJ SOLN
4.0000 mg | Freq: Once | INTRAMUSCULAR | Status: AC
  Administered 2024-03-18: 4 mg via INTRAVENOUS
  Filled 2024-03-18: qty 2

## 2024-03-18 MED ORDER — ONDANSETRON 4 MG PO TBDP: 4.0000 mg | ORAL_TABLET | Freq: Three times a day (TID) | ORAL | 0 refills | Status: AC | PRN

## 2024-03-18 NOTE — ED Provider Notes (Signed)
  EMERGENCY DEPARTMENT AT [facility] Provider Note   CSN: [id number] Arrival date & time: 03/18/24  0900     History  Chief Complaint  Patient presents with   Weakness   Nausea    Rachel Spencer is a 89 y.o. female.  The history is provided by the patient, a relative, the EMS personnel and medical records. No language interpreter was used.  Weakness    89 year old female history of A-fib, DVT, currently on Eliquis  brought here via EMS for evaluation of dizziness.  History obtained through patient and through daughter who is at bedside.  Patient got up this morning and states that she felt very "sick".  She feels as if she has no strength to get up.  She feels dizzy nauseous and having vomiting.  Light and sound seem to be bothering her.  She reach out to her daughter who called EMS and patient was brought here.  Patient is hard of hearing.  Daughter mentioned she has had history of headaches that has some similar feature.  Patient does not endorse having headache but does endorse dizzy.  Does not complain of chest pain or trouble breathing.  Denies any abdominal pain.  No urinary complaint.  She felt fine last night.  Home Medications Prior to Admission medications   Medication Sig Start Date End Date Taking? Authorizing Provider  acetaminophen  (TYLENOL ) 325 MG tablet Take 650 mg by mouth every 6 (six) hours as needed for mild pain.    [provider]  apixaban  (ELIQUIS ) 2.5 MG TABS tablet Take 1 tablet (2.5 mg total) by mouth 2 (two) times daily. 03/17/24   Arleen Lacer, MD  Chlorpheniramine Maleate (EQ CHLORTABS PO) Take 4 mg by mouth as needed (for allergies). 4mg     [provider]  Melatonin 5 MG CAPS Take 1 capsule by mouth at bedtime.    [provider]  metoprolol  succinate (TOPROL -XL) 25 MG 24 hr tablet Take 25 mg by mouth daily.    [provider]  naphazoline-glycerin (CLEAR EYES REDNESS) 0.012-0.25 % SOLN 1-2 drops  4 (four) times daily as needed for eye irritation.    [provider]  topiramate (TOPAMAX) 50 MG tablet Take 50 mg by mouth daily. 12/14/21   [provider]      Allergies    Tramadol, Codeine, Depakote [divalproex sodium], Aspirin, Brimonidine tartrate, Cefadroxil, Ibuprofen, Rocklatan [netarsudil-latanoprost], Ropinirole, Alendronate sodium, and Vitamin d analogs    Review of Systems   Review of Systems  Neurological:  Positive for weakness.  All other systems reviewed and are negative.   Physical Exam Updated Vital Signs BP (!) 178/78 (BP Location: Left Arm)   Pulse (!) 49   Resp 14   Ht 5\' 3"  (1.6 m)   Wt 48 kg   SpO2 97%   BMI 18.74 kg/m  Physical Exam Vitals and nursing note reviewed.  Constitutional:      Appearance: She is well-developed.     Comments: Rachel Spencer female, appears uncomfortable, actively vomiting and covering her eyes with her hand  HENT:     Head: Normocephalic and atraumatic.     Mouth/Throat:     Mouth: Mucous membranes are moist.  Eyes:     Extraocular Movements: Extraocular movements intact.     Conjunctiva/sclera: Conjunctivae normal.     Pupils: Pupils are equal, round, and reactive to light.  Cardiovascular:     Rate and Rhythm: Rhythm irregular.     Pulses: Normal pulses.  Heart sounds: Normal heart sounds.  Pulmonary:     Effort: Pulmonary effort is normal.     Breath sounds: No wheezing, rhonchi or rales.  Abdominal:     Palpations: Abdomen is soft.     Tenderness: There is no abdominal tenderness.  Musculoskeletal:     Cervical back: Normal range of motion and neck supple. No rigidity.     Comments: Weak but equal strength throughout all 4 extremities  Skin:    Findings: No rash.  Neurological:     Mental Status: She is alert.     GCS: GCS eye subscore is 3. GCS verbal subscore is 5. GCS motor subscore is 6.     Cranial Nerves: Cranial nerves 2-12 are intact.     Comments: Gait not tested   Psychiatric:        Mood and Affect: Mood normal.     ED Results / Procedures / Treatments   Labs (all labs ordered are listed, but only abnormal results are displayed) Labs Reviewed  COMPREHENSIVE METABOLIC PANEL WITH GFR - Abnormal; Notable for the following components:      Result Value   Glucose, Bld 142 (*)    All other components within normal limits  URINALYSIS, ROUTINE W REFLEX MICROSCOPIC - Abnormal; Notable for the following components:   APPearance CLOUDY (*)    Glucose, UA 50 (*)    Hgb urine dipstick SMALL (*)    Bacteria, UA RARE (*)    All other components within normal limits  CBG MONITORING, ED - Abnormal; Notable for the following components:   Glucose-Capillary 134 (*)    All other components within normal limits  CULTURE, BLOOD (ROUTINE X 2)  CULTURE, BLOOD (ROUTINE X 2)  CBC  I-STAT CG4 LACTIC ACID, ED  I-STAT CG4 LACTIC ACID, ED    EKG EKG Interpretation Date/Time:  Thursday Mar 18 2024 11:49:56 EDT Ventricular Rate:  114 PR Interval:  178 QRS Duration:  72 QT Interval:  335 QTC Calculation: 462 R Axis:   60  Text Interpretation: Atrial fibrillation Abnormal R-wave progression, early transition ST depression, consider ischemia, diffuse lds No STEMI Confirmed by Abby Hocking [phone]) on 03/18/2024 12:23:53 PM  Radiology DG Chest Portable 1 View Result Date: 03/18/2024 CLINICAL DATA:  Sepsis. EXAM: PORTABLE CHEST 1 VIEW COMPARISON:  03/29/2021. FINDINGS: Bilateral lungs appear hyperexpanded and hyperlucent with coarse bronchovascular markings, in keeping with COPD. Redemonstration of nonspecific opacities overlying the bilateral upper lung zones, likely representing underlying fibrosis/scarring. Bilateral lungs otherwise appear clear. No dense consolidation or lung collapse. Bilateral costophrenic angles are clear. Stable cardio-mediastinal silhouette. No acute osseous abnormalities. The soft tissues are within normal limits. IMPRESSION: *No active  disease. COPD. Electronically Signed   By: Beula Brunswick M.D.   On: 03/18/2024 11:27   CT HEAD WO CONTRAST Result Date: 03/18/2024 CLINICAL DATA:  Syncope/presyncope with cerebrovascular cause suspected EXAM: CT HEAD WITHOUT CONTRAST TECHNIQUE: Contiguous axial images were obtained from the base of the skull through the vertex without intravenous contrast. RADIATION DOSE REDUCTION: This exam was performed according to the departmental dose-optimization program which includes automated exposure control, adjustment of the mA and/or kV according to patient size and/or use of iterative reconstruction technique. COMPARISON:  None Available. FINDINGS: Brain: No evidence of acute infarction, hemorrhage, hydrocephalus, extra-axial collection or mass lesion/mass effect. Low-density in the cerebral white matter attributed to chronic small vessel ischemia, moderately extensive. Vascular: No hyperdense vessel or unexpected calcification. Skull: Normal. Negative for fracture or focal lesion. Sinuses/Orbits:  No acute finding. IMPRESSION: No acute or reversible finding. Electronically Signed   By: Ronnette Coke M.D.   On: 03/18/2024 10:31    Procedures .Critical Care  Performed by: Debbra Fairy, PA-C Authorized by: Debbra Fairy, PA-C   Critical care provider statement:    Critical care time (minutes):  30   Critical care was time spent personally by me on the following activities:  Development of treatment plan with patient or surrogate, discussions with consultants, evaluation of patient's response to treatment, examination of patient, ordering and review of laboratory studies, ordering and review of radiographic studies, ordering and performing treatments and interventions, pulse oximetry, re-evaluation of patient's condition and review of old charts     Medications Ordered in ED Medications  meclizine (ANTIVERT) tablet 25 mg (25 mg Oral Not Given 03/18/24 0926)  promethazine (PHENERGAN) 12.5 mg in sodium  chloride 0.9 % 50 mL IVPB (0 mg Intravenous Hold 03/18/24 1047)  ondansetron (ZOFRAN) injection 4 mg (4 mg Intravenous Given 03/18/24 0926)  sodium chloride  0.9 % bolus 1,000 mL (0 mLs Intravenous Stopped 03/18/24 1242)    ED Course/ Medical Decision Making/ A&P                                 Medical Decision Making Amount and/or Complexity of Data Reviewed Labs: ordered. Radiology: ordered.  Risk Prescription drug management.   BP (!) 178/78 (BP Location: Left Arm)   Pulse (!) 110   Resp 14   Ht 5\' 3"  (1.6 m)   Wt 48 kg   SpO2 97%   BMI 18.74 kg/m   55:9 AM   89 year old female history of A-fib, DVT, currently on Eliquis  brought here via EMS for evaluation of dizziness.  History obtained through patient and through daughter who is at bedside.  Patient got up this morning and states that she felt very "sick".  She feels as if she has no strength to get up.  She feels dizzy nauseous and having vomiting.  Light and sound seem to be bothering her.  She reach out to her daughter who called EMS and patient was brought here.  Patient is hard of hearing.  Daughter mentioned she has had history of headaches that has some similar feature.  Patient does not endorse having headache but does endorse dizzy.  Does not complain of chest pain or trouble breathing.  Denies any abdominal pain.  No urinary complaint.  She felt fine last night.  On exam patient appears uncomfortable with her eyes closed actively vomiting.  She is without any focal neurodeficit.  No nuchal rigidity.  Patient has a rectal temperature of 94.5.  Due to potential of sepsis, will obtain blood culture, check lactic acid, will obtain a portable chest x-ray as well and will consider antibiotic.  -Labs ordered, independently viewed and interpreted by me.  Labs remarkable for urine with cloudy appearance but no signs of UTI.  CBG 134.  Normal lactic acid.  Normal WBC, electrolytes are reassuring -The patient was maintained on a  cardiac monitor.  I personally viewed and interpreted the cardiac monitored which showed an underlying rhythm of: Atrial fibrillation -Imaging independently viewed and interpreted by me and I agree with radiologist's interpretation.  Result remarkable for chest x-ray unremarkable, head CT scan without concerning finding -This patient presents to the ED for concern of weakness, this involves an extensive number of treatment options, and is a complaint that carries  with it a high risk of complications and morbidity.  The differential diagnosis includes catecholamine response, peripheral vertigo, complicated migraine, sepsis, electrolyte imbalance, anemia, cardiac arrhythmia -Co morbidities that complicate the patient evaluation includes A-fib, DVT -Treatment includes meclizine, IV fluid, Zofran, Phenergan -Reevaluation of the patient after these medicines showed that the patient improved -PCP office notes or outside notes reviewed -Discussion with attending Dr. Dolan Freiberg -Escalation to admission/observation considered: patients feels much better, is comfortable with discharge, and will follow up with PCP -Prescription medication considered, patient comfortable with zofran for nausea -Social Determinant of Health considered   On initial exam patient was very nauseous and vomiting and feeling pretty uncomfortable.  She was globally weak.  Her symptoms are suggestive of a catecholamine response.  She also had a rectal temp of 95 and patient was placed on a Lawyer.  Workup overall reassuring, on reassessment patient states she feels much better.  She was able to ambulate without assist.  She is tolerates p.o.  Patient states she has 2 similar episodes like this in the past but not as intense.  At this time, with steady improvement of symptoms, and no obvious source of infection through further discussion we felt patient is stable for discharge.  Gave patient return precaution.         Final Clinical  Impression(s) / ED Diagnoses Final diagnoses:  Dizzy spells  Hypothermia, initial encounter  Nausea and vomiting, unspecified vomiting type    Rx / DC Orders ED Discharge Orders          Ordered    ondansetron (ZOFRAN-ODT) 4 MG disintegrating tablet  Every 8 hours PRN        03/18/24 1436              Debbra Fairy, PA-C 03/18/24 1437    Long, Joshua G, MD 03/19/24 [phone]

## 2024-03-18 NOTE — ED Triage Notes (Addendum)
 Pt BIB EMS for c/o weakness, N/V, photosensitivity, headache, 2 episodes of emesis with EMS. 4mg  Zofran given. Speech is slowed, daughter says tremor in voice is normal but slowness is not. No recent falls or injuries per family at scene. Hx of Afib, migraines but daughter says this is not how her migraines normally present. HOH, Aox4.

## 2024-03-18 NOTE — ED Notes (Signed)
 Pt ambulated to bathroom with no assistance.

## 2024-03-18 NOTE — ED Notes (Signed)
 Pt transported to CT ?

## 2024-03-18 NOTE — ED Notes (Signed)
 Bair hugger turned down per patient request

## 2024-03-18 NOTE — Discharge Instructions (Addendum)
 Your evaluated for your symptoms.  Your symptoms may be due to a complicated migraine or a catecholamine response which is a stress response.  Fortunately no obvious signs of infection.  I am glad that you are feeling better.  You may take Zofran as needed for nausea and you may follow-up closely with your doctor for further care.  Return if you have any concern.

## 2024-03-18 NOTE — ED Notes (Signed)
 Pt had episode of wretching but no emesis

## 2024-03-22 ENCOUNTER — Encounter: Payer: Self-pay | Admitting: Cardiology

## 2024-03-22 DIAGNOSIS — R0989 Other specified symptoms and signs involving the circulatory and respiratory systems: Secondary | ICD-10-CM | POA: Insufficient documentation

## 2024-03-22 NOTE — Assessment & Plan Note (Signed)
 Seems to be in sinus rhythm today with no symptoms to suggest recurrent episodes.  .Current management with Eliquis  effective without bleeding issues. - [facility]. - Continue low-dose Toprol  25 mg daily for rate control.  She is aware of when she should go to the ER for breakthrough spells-tachycardia that is symptomatic lasting couple hours. Otherwise, she would like to avoid going to the ER. She was called during the day (to consider cardioversion.).

## 2024-03-22 NOTE — Assessment & Plan Note (Signed)
 Blood pressure generally within 130s/140s systolic. Occasional elevated readings likely stress-related. Prefers slightly higher blood pressure to avoid hypotension-related dizziness. - Recheck blood pressure before leaving the clinic. - Maintain current metoprolol  dosage. - Keep a log of blood pressure readings for a week before next visit.

## 2024-03-22 NOTE — Assessment & Plan Note (Signed)
 Stable.  Edema well-controlled. Recommend support stockings when seated or standing for long period time

## 2024-03-22 NOTE — Assessment & Plan Note (Signed)
 History of orthostatic hypotension and unsteady gait.  Allow for permissive hypertension. Low threshold to consider stopping Eliquis  where she did have recurrent falls.

## 2024-03-22 NOTE — Assessment & Plan Note (Signed)
 History of DVT.  Remains on Eliquis  for A-fib.

## 2024-03-22 NOTE — Assessment & Plan Note (Signed)
 Concerned that some dizziness could be related to dehydration, inadequate p.o. intake. Weight seems to be stabilized now but I am concerned that she may have some orthostatic dizziness and therefore primary elected to be overly aggressive with blood pressure management.

## 2024-03-23 DIAGNOSIS — Z681 Body mass index (BMI) 19 or less, adult: Secondary | ICD-10-CM | POA: Diagnosis not present

## 2024-03-23 DIAGNOSIS — R42 Dizziness and giddiness: Secondary | ICD-10-CM | POA: Diagnosis not present

## 2024-03-23 LAB — CULTURE, BLOOD (ROUTINE X 2)
Culture: NO GROWTH
Culture: NO GROWTH

## 2024-04-28 DIAGNOSIS — D2261 Melanocytic nevi of right upper limb, including shoulder: Secondary | ICD-10-CM | POA: Diagnosis not present

## 2024-04-28 DIAGNOSIS — D2239 Melanocytic nevi of other parts of face: Secondary | ICD-10-CM | POA: Diagnosis not present

## 2024-04-28 DIAGNOSIS — Z85828 Personal history of other malignant neoplasm of skin: Secondary | ICD-10-CM | POA: Diagnosis not present

## 2024-04-28 DIAGNOSIS — D485 Neoplasm of uncertain behavior of skin: Secondary | ICD-10-CM | POA: Diagnosis not present

## 2024-04-28 DIAGNOSIS — L821 Other seborrheic keratosis: Secondary | ICD-10-CM | POA: Diagnosis not present

## 2024-04-28 DIAGNOSIS — D2262 Melanocytic nevi of left upper limb, including shoulder: Secondary | ICD-10-CM | POA: Diagnosis not present

## 2024-05-05 DIAGNOSIS — R1313 Dysphagia, pharyngeal phase: Secondary | ICD-10-CM | POA: Diagnosis not present

## 2024-05-05 DIAGNOSIS — R498 Other voice and resonance disorders: Secondary | ICD-10-CM | POA: Diagnosis not present

## 2024-05-05 DIAGNOSIS — R490 Dysphonia: Secondary | ICD-10-CM | POA: Diagnosis not present

## 2024-05-05 DIAGNOSIS — R633 Feeding difficulties, unspecified: Secondary | ICD-10-CM | POA: Diagnosis not present

## 2024-05-05 DIAGNOSIS — R1310 Dysphagia, unspecified: Secondary | ICD-10-CM | POA: Diagnosis not present

## 2024-05-05 DIAGNOSIS — J385 Laryngeal spasm: Secondary | ICD-10-CM | POA: Diagnosis not present

## 2024-05-05 DIAGNOSIS — M1990 Unspecified osteoarthritis, unspecified site: Secondary | ICD-10-CM | POA: Diagnosis not present

## 2024-05-05 DIAGNOSIS — R1314 Dysphagia, pharyngoesophageal phase: Secondary | ICD-10-CM | POA: Diagnosis not present

## 2024-05-10 DIAGNOSIS — I10 Essential (primary) hypertension: Secondary | ICD-10-CM | POA: Diagnosis not present

## 2024-05-10 DIAGNOSIS — I480 Paroxysmal atrial fibrillation: Secondary | ICD-10-CM | POA: Diagnosis not present

## 2024-05-10 DIAGNOSIS — R42 Dizziness and giddiness: Secondary | ICD-10-CM | POA: Diagnosis not present

## 2024-05-24 ENCOUNTER — Ambulatory Visit: Admitting: Podiatry

## 2024-05-31 ENCOUNTER — Ambulatory Visit (INDEPENDENT_AMBULATORY_CARE_PROVIDER_SITE_OTHER): Admitting: Podiatry

## 2024-05-31 ENCOUNTER — Encounter: Payer: Self-pay | Admitting: Podiatry

## 2024-05-31 DIAGNOSIS — M2041 Other hammer toe(s) (acquired), right foot: Secondary | ICD-10-CM | POA: Diagnosis not present

## 2024-05-31 DIAGNOSIS — D2372 Other benign neoplasm of skin of left lower limb, including hip: Secondary | ICD-10-CM

## 2024-05-31 NOTE — Progress Notes (Signed)
  Subjective:  Patient ID: Rachel Spencer, female    DOB: 09/15/1934,   MRN: [id number]  Chief Complaint  Patient presents with   Callouses    I have this thing on the bottom of my left foot.  It's painful in my Sunday shoes.  On my right foot, I have a real bad middle toe.  It's wanting to spike up like a Hammer Toe, the biggest problem is the toenail on that toe.    89 y.o. female presents for concern of lesion on her left great toe that has been present for quite a while and hurt in certain shoes. Relates she also has trouble with her right middle hammertoe and the nail on that toe.  Denies any current treatments.  . Denies any other pedal complaints. Denies n/v/f/c.   Past Medical History:  Diagnosis Date   Arthritis    DVT (deep venous thrombosis) (HCC)    Esophageal reflux    Hypercoagulable state due to atrial fibrillation (HCC) 04/05/2021   Hyperlipidemia    Paroxysmal atrial fibrillation (HCC) 03/2021   Diagnosed with A-fib RVR on 03/29/2021 in setting of cataract surgery.  Was in A-fib upon arrival to the ER.  Had nausea and dizziness. => Spontaneously cardioverted in ER.  CHA2DS2-VASc score 3.  Rate control with metoprolol , DOAC-Eliquis  2.5 mg twice daily   Varicose veins    History of vein stripping    Objective:  Physical Exam: Vascular: DP/PT pulses 2/4 bilateral. CFT <3 seconds. Normal hair growth on digits. No edema.  Skin. No lacerations or abrasions bilateral feet. Nails 2-5 bilateral are thickened and dystrophic. Plantar first metatarsal on left hyperkeratotic cored lesion noted with tenderness and disruption of skin lines.  Musculoskeletal: MMT 5/5 bilateral lower extremities in DF, PF, Inversion and Eversion. Deceased ROM in DF of ankle joint. Hammered digits 2-5 bilateral.  Neurological: Sensation intact to light touch.   Assessment:   1. Benign neoplasm of skin of foot, left   2. Hammertoe of right foot      Plan:  Patient was evaluated and treated and all  questions answered. -Discussed benign skin lesions with patient and treatment options.  -Hyperkeratotic tissue was debrided with chisel without incident.  -Applied salycylic acid treatment to area with dressing. Advised to remove bandaging tomorrow.  -Encouraged daily moisturizing -Discussed use of pumice stone -Advised good supportive shoes and inserts -Educated on hammertoes and treatment options  -Discussed padding including toe caps and crest pads.  -Discussed need for potential surgery if pain does not improved. However she would likely not be a good surgical candidate.  -Patient to follow-up as needed. Discussed calling if any changes or increased pain.     Asberry Failing, DPM

## 2024-06-08 DIAGNOSIS — K219 Gastro-esophageal reflux disease without esophagitis: Secondary | ICD-10-CM | POA: Diagnosis not present

## 2024-06-08 DIAGNOSIS — M810 Age-related osteoporosis without current pathological fracture: Secondary | ICD-10-CM | POA: Diagnosis not present

## 2024-06-10 DIAGNOSIS — K080 Exfoliation of teeth due to systemic causes: Secondary | ICD-10-CM | POA: Diagnosis not present

## 2024-06-30 DIAGNOSIS — I10 Essential (primary) hypertension: Secondary | ICD-10-CM | POA: Diagnosis not present

## 2024-06-30 DIAGNOSIS — Z23 Encounter for immunization: Secondary | ICD-10-CM | POA: Diagnosis not present

## 2024-07-13 DIAGNOSIS — G43719 Chronic migraine without aura, intractable, without status migrainosus: Secondary | ICD-10-CM | POA: Diagnosis not present

## 2024-07-13 DIAGNOSIS — M542 Cervicalgia: Secondary | ICD-10-CM | POA: Diagnosis not present

## 2024-07-27 DIAGNOSIS — H401121 Primary open-angle glaucoma, left eye, mild stage: Secondary | ICD-10-CM | POA: Diagnosis not present

## 2024-07-27 DIAGNOSIS — Z961 Presence of intraocular lens: Secondary | ICD-10-CM | POA: Diagnosis not present

## 2024-07-27 DIAGNOSIS — H401413 Capsular glaucoma with pseudoexfoliation of lens, right eye, severe stage: Secondary | ICD-10-CM | POA: Diagnosis not present

## 2024-07-28 DIAGNOSIS — Z85828 Personal history of other malignant neoplasm of skin: Secondary | ICD-10-CM | POA: Diagnosis not present

## 2024-07-28 DIAGNOSIS — L2989 Other pruritus: Secondary | ICD-10-CM | POA: Diagnosis not present

## 2024-07-28 DIAGNOSIS — L810 Postinflammatory hyperpigmentation: Secondary | ICD-10-CM | POA: Diagnosis not present

## 2024-07-28 DIAGNOSIS — L821 Other seborrheic keratosis: Secondary | ICD-10-CM | POA: Diagnosis not present

## 2024-08-23 DIAGNOSIS — K080 Exfoliation of teeth due to systemic causes: Secondary | ICD-10-CM | POA: Diagnosis not present

## 2024-09-16 ENCOUNTER — Telehealth: Payer: Self-pay | Admitting: Cardiology

## 2024-09-16 DIAGNOSIS — I480 Paroxysmal atrial fibrillation: Secondary | ICD-10-CM

## 2024-09-16 NOTE — Telephone Encounter (Signed)
*  STAT* If patient is at the pharmacy, call can be transferred to refill team.   1. Which medications need to be refilled? (please list name of each medication and dose if known)  apixaban  (ELIQUIS ) 2.5 MG TABS tablet   2. Which pharmacy/location (including street and city if local pharmacy) is medication to be sent to? [id number] - [place], [place] - [address]    3. Do they need a 30 day or 90 day supply?  30 day supply + refills if possible.

## 2024-09-17 MED ORDER — APIXABAN 2.5 MG PO TABS: 2.5000 mg | ORAL_TABLET | Freq: Two times a day (BID) | ORAL | 1 refills | Status: AC

## 2024-09-17 NOTE — Telephone Encounter (Signed)
 Prescription refill request for Eliquis  received. Indication: AFIB Last office visit: 03/17/24 Scr:  0.65 03/18/24 Age: 89 Weight: 48 KG

## 2024-10-22 ENCOUNTER — Other Ambulatory Visit (HOSPITAL_COMMUNITY): Payer: Self-pay | Admitting: Internal Medicine

## 2024-10-29 NOTE — Progress Notes (Signed)
 Creatinine and calcium wnl on 10/27/2024  Sherry Pennant, PharmD, MPH, BCPS, CPP Clinical Pharmacist

## 2024-11-01 ENCOUNTER — Telehealth: Payer: Self-pay

## 2024-11-01 NOTE — Telephone Encounter (Signed)
 Dr. Faythe, patient will be scheduled as soon as possible.  Auth Submission: NO AUTH NEEDED Site of care: Site of care: [facility] Payer: [employer] state health plan Medication & CPT/J Code(s) submitted: Reclast  (Zolendronic acid) [id number] Diagnosis Code:  Route of submission (phone, fax, portal):  Phone # Fax # Auth type: Buy/Bill PB Units/visits requested: 5mg  x 1 dose Reference number:  Approval from: 11/01/24 to 02/24/25

## 2024-11-10 ENCOUNTER — Ambulatory Visit

## 2024-11-10 VITALS — BP 133/70 | HR 64 | Temp 98.3°F | Resp 14 | Ht 63.0 in | Wt 96.8 lb

## 2024-11-10 DIAGNOSIS — M810 Age-related osteoporosis without current pathological fracture: Secondary | ICD-10-CM

## 2024-11-10 MED ORDER — DIPHENHYDRAMINE HCL 25 MG PO CAPS
25.0000 mg | ORAL_CAPSULE | Freq: Once | ORAL | Status: AC
  Administered 2024-11-10: 25 mg via ORAL
  Filled 2024-11-10: qty 1

## 2024-11-10 MED ORDER — ACETAMINOPHEN 325 MG PO TABS
650.0000 mg | ORAL_TABLET | Freq: Once | ORAL | Status: AC
  Administered 2024-11-10: 650 mg via ORAL
  Filled 2024-11-10: qty 2

## 2024-11-10 MED ORDER — SODIUM CHLORIDE 0.9 % IV SOLN: INTRAVENOUS | Status: DC

## 2024-11-10 MED ORDER — ZOLEDRONIC ACID 5 MG/100ML IV SOLN
5.0000 mg | Freq: Once | INTRAVENOUS | Status: AC
  Administered 2024-11-10: 5 mg via INTRAVENOUS
  Filled 2024-11-10: qty 100

## 2024-11-10 NOTE — Patient Instructions (Signed)

## 2024-11-10 NOTE — Progress Notes (Signed)
 Diagnosis:  Osteoporosis  Provider:  Lonna Coder MD  Procedure: IV Infusion  IV Type: Peripheral, IV Location: L Antecubital   Reclast  (Zolendronic Acid), Dose: 5 mg  Infusion Start Time: 1509  Infusion Stop Time: 1540  Post Infusion IV Care: Patient declined observation and Peripheral IV Discontinued  Discharge: Condition: Good, Destination: Home . AVS Declined  Performed by:  Maximiano JONELLE Pouch, LPN
